# Patient Record
Sex: Female | Born: 1950 | Race: White | Hispanic: No | Marital: Married | Smoking: Never smoker
Health system: Southern US, Community
[De-identification: ages and names within clinical notes are randomized; demographics above are authoritative.]

## PROBLEM LIST (undated history)

## (undated) DIAGNOSIS — I872 Venous insufficiency (chronic) (peripheral): Secondary | ICD-10-CM

## (undated) DIAGNOSIS — B1920 Unspecified viral hepatitis C without hepatic coma: Secondary | ICD-10-CM

## (undated) DIAGNOSIS — Z9861 Coronary angioplasty status: Secondary | ICD-10-CM

## (undated) DIAGNOSIS — K759 Inflammatory liver disease, unspecified: Secondary | ICD-10-CM

## (undated) DIAGNOSIS — E039 Hypothyroidism, unspecified: Secondary | ICD-10-CM

## (undated) DIAGNOSIS — I10 Essential (primary) hypertension: Secondary | ICD-10-CM

## (undated) DIAGNOSIS — E785 Hyperlipidemia, unspecified: Secondary | ICD-10-CM

## (undated) DIAGNOSIS — I2510 Atherosclerotic heart disease of native coronary artery without angina pectoris: Secondary | ICD-10-CM

## (undated) DIAGNOSIS — I8390 Asymptomatic varicose veins of unspecified lower extremity: Secondary | ICD-10-CM

## (undated) DIAGNOSIS — T8859XA Other complications of anesthesia, initial encounter: Secondary | ICD-10-CM

## (undated) DIAGNOSIS — K649 Unspecified hemorrhoids: Secondary | ICD-10-CM

## (undated) DIAGNOSIS — M1990 Unspecified osteoarthritis, unspecified site: Secondary | ICD-10-CM

## (undated) DIAGNOSIS — Z955 Presence of coronary angioplasty implant and graft: Secondary | ICD-10-CM

## (undated) DIAGNOSIS — Z9289 Personal history of other medical treatment: Secondary | ICD-10-CM

## (undated) DIAGNOSIS — I2119 ST elevation (STEMI) myocardial infarction involving other coronary artery of inferior wall: Secondary | ICD-10-CM

## (undated) DIAGNOSIS — R011 Cardiac murmur, unspecified: Secondary | ICD-10-CM

## (undated) DIAGNOSIS — Z8489 Family history of other specified conditions: Secondary | ICD-10-CM

## (undated) HISTORY — DX: Venous insufficiency (chronic) (peripheral): I87.2

## (undated) HISTORY — DX: Hypothyroidism, unspecified: E03.9

## (undated) HISTORY — PX: ABDOMINAL HYSTERECTOMY: SHX81

## (undated) HISTORY — DX: Hyperlipidemia, unspecified: E78.5

## (undated) HISTORY — DX: Unspecified osteoarthritis, unspecified site: M19.90

## (undated) HISTORY — PX: BUNIONECTOMY: SHX129

## (undated) HISTORY — DX: Unspecified hemorrhoids: K64.9

## (undated) HISTORY — PX: VARICOSE VEIN SURGERY: SHX832

## (undated) HISTORY — DX: Presence of coronary angioplasty implant and graft: Z95.5

## (undated) HISTORY — PX: CYSTOCELE REPAIR: SHX163

## (undated) HISTORY — DX: Asymptomatic varicose veins of unspecified lower extremity: I83.90

## (undated) HISTORY — DX: ST elevation (STEMI) myocardial infarction involving other coronary artery of inferior wall: I21.19

## (undated) HISTORY — DX: Atherosclerotic heart disease of native coronary artery without angina pectoris: I25.10

## (undated) HISTORY — DX: Essential (primary) hypertension: I10

## (undated) HISTORY — DX: Coronary angioplasty status: Z98.61

## (undated) HISTORY — PX: WISDOM TOOTH EXTRACTION: SHX21

## (undated) HISTORY — PX: OTHER SURGICAL HISTORY: SHX169

## (undated) HISTORY — PX: LIVER BIOPSY: SHX301

---

## 2008-09-15 DIAGNOSIS — I2119 ST elevation (STEMI) myocardial infarction involving other coronary artery of inferior wall: Secondary | ICD-10-CM

## 2008-09-15 DIAGNOSIS — I2510 Atherosclerotic heart disease of native coronary artery without angina pectoris: Secondary | ICD-10-CM | POA: Insufficient documentation

## 2008-09-15 DIAGNOSIS — Z9861 Coronary angioplasty status: Secondary | ICD-10-CM

## 2008-09-15 DIAGNOSIS — Z955 Presence of coronary angioplasty implant and graft: Secondary | ICD-10-CM

## 2008-09-15 HISTORY — DX: Presence of coronary angioplasty implant and graft: Z95.5

## 2008-09-15 HISTORY — DX: Atherosclerotic heart disease of native coronary artery without angina pectoris: I25.10

## 2008-09-15 HISTORY — DX: Coronary angioplasty status: Z98.61

## 2008-09-15 HISTORY — PX: TRANSTHORACIC ECHOCARDIOGRAM: SHX275

## 2008-09-15 HISTORY — DX: ST elevation (STEMI) myocardial infarction involving other coronary artery of inferior wall: I21.19

## 2008-09-28 DIAGNOSIS — I252 Old myocardial infarction: Secondary | ICD-10-CM | POA: Insufficient documentation

## 2008-10-16 HISTORY — PX: TRANSTHORACIC ECHOCARDIOGRAM: SHX275

## 2008-10-23 DIAGNOSIS — Z955 Presence of coronary angioplasty implant and graft: Secondary | ICD-10-CM | POA: Insufficient documentation

## 2008-10-23 HISTORY — PX: CORONARY ANGIOPLASTY WITH STENT PLACEMENT: SHX49

## 2008-10-23 HISTORY — PX: CARDIAC CATHETERIZATION: SHX172

## 2010-11-17 ENCOUNTER — Other Ambulatory Visit: Payer: Self-pay | Admitting: Family Medicine

## 2010-11-17 DIAGNOSIS — Z1231 Encounter for screening mammogram for malignant neoplasm of breast: Secondary | ICD-10-CM

## 2010-11-27 ENCOUNTER — Ambulatory Visit: Payer: Self-pay

## 2011-08-31 HISTORY — PX: OTHER SURGICAL HISTORY: SHX169

## 2013-01-12 ENCOUNTER — Encounter: Payer: Self-pay | Admitting: *Deleted

## 2013-01-13 ENCOUNTER — Encounter: Payer: Self-pay | Admitting: Internal Medicine

## 2013-01-17 ENCOUNTER — Encounter: Payer: Self-pay | Admitting: Cardiology

## 2013-01-17 ENCOUNTER — Ambulatory Visit (INDEPENDENT_AMBULATORY_CARE_PROVIDER_SITE_OTHER): Admitting: Cardiology

## 2013-01-17 VITALS — BP 110/84 | HR 69 | Ht 66.0 in | Wt 171.2 lb

## 2013-01-17 DIAGNOSIS — E785 Hyperlipidemia, unspecified: Secondary | ICD-10-CM

## 2013-01-17 DIAGNOSIS — I2510 Atherosclerotic heart disease of native coronary artery without angina pectoris: Secondary | ICD-10-CM

## 2013-01-17 DIAGNOSIS — M1990 Unspecified osteoarthritis, unspecified site: Secondary | ICD-10-CM

## 2013-01-17 DIAGNOSIS — I872 Venous insufficiency (chronic) (peripheral): Secondary | ICD-10-CM

## 2013-01-17 DIAGNOSIS — I10 Essential (primary) hypertension: Secondary | ICD-10-CM

## 2013-01-17 NOTE — Patient Instructions (Signed)
Things seem stable from a cardiac standpoint -- Blood Pressure & HR are stable.  Please ask your PCP to forward your blood work to our office.  I think we are fine for annual check-ups -- you should hear from the office within ~ 2 months of your scheduled time to confirm the date & time.  Marykay Lex, MD

## 2013-01-31 ENCOUNTER — Encounter: Payer: Self-pay | Admitting: Cardiology

## 2013-01-31 DIAGNOSIS — I10 Essential (primary) hypertension: Secondary | ICD-10-CM | POA: Insufficient documentation

## 2013-01-31 DIAGNOSIS — I872 Venous insufficiency (chronic) (peripheral): Secondary | ICD-10-CM | POA: Insufficient documentation

## 2013-01-31 DIAGNOSIS — E785 Hyperlipidemia, unspecified: Secondary | ICD-10-CM | POA: Insufficient documentation

## 2013-01-31 NOTE — Progress Notes (Signed)
PCP: Warrick Parisian, MD  Clinic Note: Chief Complaint  Patient presents with  . ROV 12 months    Varicose veins, occasional swelling in ankles   HPI: Ashley Morse is a 62 y.o. female with a PMH below who presents today for followup of her coronary disease and venous disease. She is a very pleasant woman who I met back in 2012 after she moved from [place]. She has a history of inferior STEMI with PCI to the PDA. This is with 2 overlapping drug-eluting stents. She has preserved at ejection fraction. Her other major issue has been significant varicose veins, with a history of prior vein stripping while in [place].. Dr. Rennis Golden attempted RFA VNUS ablation of the left greater saphenous vein, that was unsuccessful because the dye was extremely dilated. She was given it to be referred to vascular surgery for possible stripping, but his to date declined. She is usually pretty good about wearing compression stockings in the wintertime, but in the summertime cannot tolerate it due to the.  Interval History: She presents today well she occasionally will notice her heart rate will race for several seconds when she is anxious. It lasts less than a minute and really doesn't make her feel lightheaded or dizzy. She otherwise really denies any active cardiac symptoms of chest pain or shortness of breath with rest or exertion. No PND orthopnea pedal along with her varicose veins leg edema. She doesn't have much edema to speak of. She denies any lightheadedness, dizziness, weakness or syncope/near syncope no TIA or amaurosis fugax symptoms. No melena, hematochezia or hematuria -- she may note occasional blood in his stool from hemorrhoids if they flare up, but otherwise does not notice any problems.  Her PCP recently stopped her simvastatin for concern of elevated LFTs. The levels are due to be rechecked soon, and the decision made whether to restart statin and if not Melissa statin intolerance. She is on  simvastatin before. She denies any claudication symptoms. She does have heaviness in her legs and some discomfort and aching from her varicose veins. She is to use it, and doesn't really think about it very much.  Past Medical History  Diagnosis Date  . History of: ST elevation myocardial infarction (STEMI) of inferior wall, subsequent episode of care May 2010    R- PDA occlusion --> PCI with DES  . CAD S/P percutaneous coronary angioplasty May 2010    Distal RPDA: 2 overlapping Promus and DES 2.5 mm x 15 mm, 2.5 mm 23 mm distal to mid RCA. EF 50%. ([place], [place])   . Presence of drug coated stent in right coronary artery May 2010    See above; no longer on DAPT.  [name] Hyperlipidemia     on simvastatin stopped due to increase liver enzyme rechecked back to normal  . Hypertension   . Bilateral venous insufficiency     Bilateral lower extremity edema,significant varicose vein; s/p vein stripping and venous abaltionof greater saph. vein  --> proved to be unsuccessful 2/2 vessel size.; Occasionally wears compression stockings during the wintertime.  cannot tolerate during the summertime;   . Osteoarthritis   . Hypothyroidism     On levothyroxine  . Hemorrhoids    Prior Cardiac Evaluation and Past Surgical History: Past Surgical History  Procedure Laterality Date  . Cardiac catheterization  10/23/2008    in [place]- for inferior STEMI, occluded RPDA  . Coronary angioplasty with stent placement  10/23/2008    occulsion of RPDA  --  2 Promus DES stent overlapping ;2.5 x 15 and 2.5 x 23 mm stents from distal RCA to PDA, ef50%  . Left venous  duplex Left 08/31/2011    Negative for DVT  . Varicose vein surgery Right   . Radiofrequency ablation - left greater saphenous vein: unsuccessful Left 08/31/2011  . Transthoracic echocardiogram  May 2010     Echo, May 2010 - following PCI for inferior STEMI. No wall motion abnormalities, EF of 60%   Allergies  Allergen Reactions  . Lactose  Intolerance (Gi)   . Sulfa Antibiotics Swelling    Skin discoloration    Current Outpatient Prescriptions  Medication Sig Dispense Refill  . Ascorbic Acid (VITAMIN C PO) Take 1 tablet by mouth daily.      [name] aspirin EC 81 MG tablet Take 81 mg by mouth daily.      [name] b complex vitamins tablet Take 1 tablet by mouth daily.      . Coenzyme Q10 200 MG capsule Take 200 mg by mouth daily.      [name] glucosamine-chondroitin 500-400 MG tablet Take 1 tablet by mouth 3 (three) times daily.      [name] levothyroxine (SYNTHROID, LEVOTHROID) 88 MCG tablet Take 88 mcg by mouth daily before breakfast.      . losartan (COZAAR) 25 MG tablet Take 25 mg by mouth daily.      . Magnesium 400 MG CAPS Take by mouth.      . Melatonin 1 MG SUBL Place 0.5 tablets under the tongue at bedtime as needed.      . Multiple Vitamins-Minerals (MULTIVITAMIN WITH MINERALS) tablet Take 1 tablet by mouth daily.      . Omega-3 Fatty Acids (FISH OIL PO) Take 1 capsule by mouth daily.      [name] OVER THE COUNTER MEDICATION Bio Flavenoid. Take 1 tablet daily      . vitamin E (VITAMIN E) 400 UNIT capsule Take 400 Units by mouth daily.       No current facility-administered medications for this visit.    History   Social History Narrative   She is a married, mother of one living offspring with one child who died at young age.    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    She is starting to "embrace retirement" & i8 enjoying her free time.      She starting beekeeping and other activities.       She still trying to exercise the gym 2-3 times a week but now is up to 3-4 times a week in addition to doing walking.       She drinks occasional glass of wine, but if she drinks more not bothered her hemorrhoids.   She does not smoke.   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    ROS: A comprehensive Review of Systems - Negative except Osteoarthritis pain mostly in her right  knee, but also ankles hips.  PHYSICAL EXAM BP 110/84  Pulse 69  Ht 5\' 6"  (1.676 m)  Wt 171 lb 3.2 oz (77.656 kg)  BMI 27.65 kg/m2 -- stable weight General: She is a pleasant, healthy appearing woman. NAD, A&O x3, answers questions appropriately. Normal mood and affect.  HEENT: NCAT. She wears glasses. EOMI. MMM. PERRL.  Neck: Supple without LAN, JVD or carotid bruit BUT pulsatile carotid arteries that mask her jugular veins; no JVD Heart: RRR, normal S1,  S2, no M/R/G. Nondisplaced PMI.  Lungs: CTAB, nonlabored, normal effort, good air movement.  Abdomen: Soft/NT/ND/NABS. No HSM. Extremities: No significant edema but extensive bilateral varicose veins and significant spider veins and purplish discoloration of her feet, left worse than right.; 2+ equal and bounding pulses in bilateral lower extremities.   WUJ:[id number] today: Yes Rate: 69 , Rhythm: Normal Sinus Rhythm, Normal ECG.  Recent Labs: None currently.  ASSESSMENT / PLAN: CAD S/P percutaneous coronary angioplasty - 2 overlapping  Promus element DES in RPDA; in setting of acute inferior STEMI Currently in 100% asymptomatic. She related having much in the way of other existing disease besides her PDA occlusion. She is 4 years out from her stents, and has just been on aspirin. She is not on a beta blocker because of prior issues with bradycardia. She is on ARB doing quite well. She was a statin which is currently being held.  Plan: Continue current course. In the absence of ongoing symptoms, would not routinely perform stress test.  Hypertension Your blood pressure was very good and stable on low dose ARB. Again with her bradycardia issues in the past, she is not on a beta blocker.  Hyperlipidemia She is due for followup with her primary physician to recheck her labs including LFTs. She was on simvastatin in the past. She has significantly adjusted diet only fish and occasionally a slow vegetables and fruits. Hopefully this will  have improved her lipid panel. I look forward to seeing the results from her primary physician.  Bilateral venous insufficiency This continued to be an issue for her, she really "soldiers on "all. He unsuccessful the ablation was difficult, because initially it looked like he was noted to quite well. She still is not very interested in going back for vein stripping procedures. It was quite a difficult experience for her and the past. She is content with wearing support low-dose in the summer and compression stockings in the winter.  Osteoarthritis She really is somewhat limited by her knee pain from arthritis. I am reluctant to let her use Tylenol because of the elevated LFTs. NSAIDS are alsonot the best option  for similar reasons related to cardiovascular risk being increased. This is Focal in her knees and hips I suggested using Voltaren gel for topical control.    and may Orders Placed This Encounter  Procedures  . EKG 12-Lead    Followup: One year  Kamyia Thomason W. Herbie Baltimore, M.D., M.S. [facility] [address] [place], [place]  [address]  [phone] Pager # [phone]

## 2013-02-01 DIAGNOSIS — M1990 Unspecified osteoarthritis, unspecified site: Secondary | ICD-10-CM | POA: Insufficient documentation

## 2013-02-01 NOTE — Assessment & Plan Note (Signed)
She is due for followup with her primary physician to recheck her labs including LFTs. She was on simvastatin in the past. She has significantly adjusted diet only fish and occasionally a slow vegetables and fruits. Hopefully this will have improved her lipid panel. I look forward to seeing the results from her primary physician.

## 2013-02-01 NOTE — Assessment & Plan Note (Addendum)
Her blood pressure was very good and stable on low dose ARB. Again with her bradycardia issues in the past, she is not on a beta blocker.

## 2013-02-01 NOTE — Assessment & Plan Note (Addendum)
Currently in 100% asymptomatic. She related having much in the way of other existing disease besides her PDA occlusion. She is 4 years out from her stents, and has just been on aspirin. She is not on a beta blocker because of prior issues with bradycardia. She is on ARB doing quite well. She was a statin which is currently being held.  Plan: Continue current course. In the absence of ongoing symptoms, would not routinely perform stress test.

## 2013-02-01 NOTE — Assessment & Plan Note (Signed)
This continued to be an issue for her, she really "soldiers on "all. He unsuccessful the ablation was difficult, because initially it looked like he was noted to quite well. She still is not very interested in going back for vein stripping procedures. It was quite a difficult experience for her and the past. She is content with wearing support low-dose in the summer and compression stockings in the winter.

## 2013-02-01 NOTE — Assessment & Plan Note (Signed)
She really is somewhat limited by her knee pain from arthritis. I am reluctant to let her use Tylenol because of the elevated LFTs. NSAIDS are alsonot the best option  for similar reasons related to cardiovascular risk being increased. This is Focal in her knees and hips I suggested using Voltaren gel for topical control.

## 2013-04-17 ENCOUNTER — Ambulatory Visit (HOSPITAL_COMMUNITY)
Admission: RE | Admit: 2013-04-17 | Discharge: 2013-04-17 | Disposition: A | Discharge status: Home / Self Care | Source: Ambulatory Visit | Attending: Cardiovascular Disease | Admitting: Cardiovascular Disease

## 2013-04-17 ENCOUNTER — Other Ambulatory Visit (HOSPITAL_COMMUNITY): Payer: Self-pay | Admitting: Family Medicine

## 2013-04-17 DIAGNOSIS — I803 Phlebitis and thrombophlebitis of lower extremities, unspecified: Secondary | ICD-10-CM

## 2013-04-17 DIAGNOSIS — M79609 Pain in unspecified limb: Secondary | ICD-10-CM

## 2013-04-17 DIAGNOSIS — I809 Phlebitis and thrombophlebitis of unspecified site: Secondary | ICD-10-CM

## 2013-04-17 DIAGNOSIS — I82819 Embolism and thrombosis of superficial veins of unspecified lower extremities: Secondary | ICD-10-CM | POA: Insufficient documentation

## 2013-04-17 DIAGNOSIS — R229 Localized swelling, mass and lump, unspecified: Secondary | ICD-10-CM

## 2013-04-17 DIAGNOSIS — I83893 Varicose veins of bilateral lower extremities with other complications: Secondary | ICD-10-CM | POA: Insufficient documentation

## 2013-04-17 NOTE — Progress Notes (Signed)
Left Lower Ext. Venous Duplex Completed. Negative for DVT. Positive for superficial vein thrombosis in the proximal cal vein varicosities.  Marilynne Halsted, BS, RDMS, RVT

## 2014-01-23 ENCOUNTER — Ambulatory Visit (INDEPENDENT_AMBULATORY_CARE_PROVIDER_SITE_OTHER): Admitting: Cardiology

## 2014-01-23 ENCOUNTER — Encounter: Payer: Self-pay | Admitting: Cardiology

## 2014-01-23 VITALS — BP 114/82 | HR 77 | Ht 65.25 in | Wt 168.0 lb

## 2014-01-23 DIAGNOSIS — E785 Hyperlipidemia, unspecified: Secondary | ICD-10-CM

## 2014-01-23 DIAGNOSIS — I2510 Atherosclerotic heart disease of native coronary artery without angina pectoris: Secondary | ICD-10-CM

## 2014-01-23 DIAGNOSIS — I10 Essential (primary) hypertension: Secondary | ICD-10-CM

## 2014-01-23 DIAGNOSIS — Z9861 Coronary angioplasty status: Secondary | ICD-10-CM

## 2014-01-23 DIAGNOSIS — I872 Venous insufficiency (chronic) (peripheral): Secondary | ICD-10-CM

## 2014-01-23 NOTE — Patient Instructions (Signed)
NO CHANGE MEDICATION   Your physician wants you to follow-up in 12 MONTH DR HARDING.  You will receive a reminder letter in the mail two months in advance. If you don't receive a letter, please call our office to schedule the follow-up appointment.

## 2014-01-25 ENCOUNTER — Encounter: Payer: Self-pay | Admitting: Cardiology

## 2014-01-25 NOTE — Progress Notes (Addendum)
PCP: Warrick Parisian, MD  Clinic Note: Chief Complaint  Patient presents with  . Annual Exam    no chest pain , no sob , edema --- will be having annual physical next week =- [facility]  [place]   HPI: Ashley Morse is a 63 y.o. female with a Cardiovascular Problem List below who presents today for annual followup of her CAD and venous disease. I last saw her in September of 2014. At that time she was doing relatively well without any major complaints. The biggest issue discussed was her venous insufficiency. She basically just can't tolerate wearing support hose or compression stockings during the summertime. She doesn't wear them in the winter however. I don't think she's interested in pursuing any further vein procedures, we did mention the possibility of referral to the vein a vascular specialists if she would change her mind.  Interval History: She presents today really without any complaints. She is due to have her physical exam next week and will have her labs checked. She has not had any recurrence of any anginal symptoms with rest or exertion. She still has short we'll episode of burst of heartbeat that her locally fast only last about 15-20 sec. They don't last long enough to cause any significant symptoms. She continues to be active but without structured exercise. She still does her be keeping another clonic activities. She still does try to go to the gym a couple days a week. With her level of exertion she denies any anginal symptoms lesser exertion. No exertional dyspnea. No PND, orthopnea or real edema. Is mostly varicose veins. She does not have any ulcers or lesions on her legs and just notes a little dullness in the leg but no significant restless leg symptoms or burning neuropathy type pain. She denies any syncopal or near syncopal episodes, TIA or amaurosis fugax symptoms. He also denies any claudication symptoms.  She never did start back on a statin since last  visit. We're curious to see what her labs look like this year.  Past Medical History  Diagnosis Date  . History of: ST elevation myocardial infarction (STEMI) of inferior wall, subsequent episode of care May 2010    R- PDA occlusion --> PCI with DES  . CAD S/P percutaneous coronary angioplasty May 2010    Distal RPDA: 2 overlapping Promus and DES 2.5 mm x 15 mm, 2.5 mm 23 mm distal to mid RCA. EF 50%. ([place], [place])   . Presence of drug coated stent in right coronary artery May 2010    See above; no longer on DAPT.  [name] Hyperlipidemia     on simvastatin stopped due to increase liver enzyme rechecked back to normal  . Hypertension   . Bilateral venous insufficiency     Bilateral lower extremity edema,significant varicose vein; s/p vein stripping and venous abaltionof greater saph. vein  --> proved to be unsuccessful 2/2 vessel size.; Occasionally wears compression stockings during the wintertime.  cannot tolerate during the summertime;   . Osteoarthritis   . Hypothyroidism     On levothyroxine  . Hemorrhoids    Prior Cardiac Evaluation and Past Surgical History: Past Surgical History  Procedure Laterality Date  . Cardiac catheterization  10/23/2008    in [place]- for inferior STEMI, occluded RPDA  . Coronary angioplasty with stent placement  10/23/2008    occulsion of RPDA  -- 2 Promus DES stent overlapping ;2.5 x 15 and 2.5 x 23 mm stents from distal RCA to  PDA, ef50%  . Left venous  duplex Left 08/31/2011    Negative for DVT  . Varicose vein surgery Right   . Radiofrequency ablation - left greater saphenous vein: unsuccessful Left 08/31/2011  . Transthoracic echocardiogram  May 2010     Echo, May 2010 - following PCI for inferior STEMI. No wall motion abnormalities, EF of 60%   MEDICATIONS AND ALLERGIES REVIEWED IN EPIC No Change in Social and Family History  ROS: A comprehensive Review of Systems - was performed Review of Systems  Constitutional: Negative for fever  and chills.  HENT: Negative for nosebleeds.   Eyes: Negative for blurred vision.  Gastrointestinal: Negative for melena.  Genitourinary: Negative for hematuria.  Musculoskeletal: Negative for myalgias.  Neurological: Negative for dizziness, sensory change, speech change, focal weakness, seizures, loss of consciousness, weakness and headaches.  Psychiatric/Behavioral: Negative for depression. The patient is not nervous/anxious.   All other systems reviewed and are negative.  if not noted in history of present illness   Wt Readings from Last 3 Encounters:  01/23/14 168 lb (76.204 kg)  01/17/13 171 lb 3.2 oz (77.656 kg)   PHYSICAL EXAM BP 114/82  Pulse 77  Ht 5' 5.25" (1.657 m)  Wt 168 lb (76.204 kg)  BMI 27.75 kg/m2 General: She is a pleasant, healthy appearing woman. NAD, A&O x3, answers questions appropriately. Normal mood and affect.  HEENT: NCAT. She wears glasses. EOMI. MMM. PERRL.  Neck: Supple without LAN, JVD or carotid bruit BUT pulsatile carotid arteries that mask her jugular veins; no JVD  Heart: RRR, normal S1, S2, no M/R/G. Nondisplaced PMI.  Lungs: CTAB, nonlabored, normal effort, good air movement.  Abdomen: Soft/NT/ND/NABS. No HSM.  Extremities: No significant edema but extensive bilateral varicose veins and significant spider veins and purplish discoloration of her feet, left worse than right.; 2+ equal and bounding pulses in bilateral lower extremities.   Adult ECG Report  Rate: 70 ;  Rhythm: normal sinus rhythm - essentially normal EKG  ASSESSMENT / PLAN: CAD S/P percutaneous coronary angioplasty - 2 overlapping  Promus element DES in RPDA; in setting of acute inferior STEMI Continues to be asymptomatic. Is on an ARB, but not on beta blocker because of history of some bradycardia in the past. On aspirin but not on a statin as indicated above. She is taking fish oil that I have asked that she increase to 3000 mg if possible. Now 5 years out from her stents. On  aspirin alone is fine. She is known to PDA occlusion that goes along with inferior hypokinesis on echo  Venous insufficiency of both lower extremities She continues to just handle the situation. Was a little bit "bummed out "that the ablation procedure was not successful. She's had in the past and is not really interested in doing more procedures. I did offer her a referral to the vascular name specialist. Dr. Arbie Cookey has essentially taken over the role that Dr. Rennis Golden he was playing here, but also has more treatment options as surgeon. The new wearing compression stockings/support hose in the wintertime and when possible summer. Otherwise keep feet elevated when possible  Hyperlipidemia with target LDL less than 70; intolerant of statin in the past due to elevated LFTs Labs are being monitored by PCP. Hopefully being off the statin her labs drawn too much out of control.  Essential hypertension Well controlled on ARB only.    No orders of the defined types were placed in this encounter.   No orders of the defined  types were placed in this encounter.    Followup: 12 months  DAVID W. Herbie Baltimore, M.D., M.S. Interventional Cardiologist [facility]   ADDENDUM: After further discussion, she is potentially in need to have surgery on her foot for bunion because there is significant venous stasis and some redness to her feet, she is asked that we do refer her to the vascular surgeons (Dr. Arbie Cookey) to evaluate both venous and arterial flow to the feet in the interest of healing or possible surgery. She would also entertain potential options for treating her varicose veins.  Marykay Lex, MD

## 2014-01-25 NOTE — Assessment & Plan Note (Signed)
Labs are being monitored by PCP. Hopefully being off the statin her labs drawn too much out of control.

## 2014-01-25 NOTE — Assessment & Plan Note (Signed)
She continues to just handle the situation. Was a little bit "bummed out "that the ablation procedure was not successful. She's had in the past and is not really interested in doing more procedures. I did offer her a referral to the vascular name specialist. Dr. Arbie Cookey has essentially taken over the role that Dr. Rennis Golden he was playing here, but also has more treatment options as surgeon. The new wearing compression stockings/support hose in the wintertime and when possible summer. Otherwise keep feet elevated when possible

## 2014-01-25 NOTE — Assessment & Plan Note (Signed)
Well controlled on ARB only.

## 2014-01-25 NOTE — Assessment & Plan Note (Addendum)
Continues to be asymptomatic. Is on an ARB, but not on beta blocker because of history of some bradycardia in the past. On aspirin but not on a statin as indicated above. She is taking fish oil that I have asked that she increase to 3000 mg if possible. Now 5 years out from her stents. On aspirin alone is fine. She is known to PDA occlusion that goes along with inferior hypokinesis on echo

## 2014-01-26 ENCOUNTER — Telehealth: Payer: Self-pay | Admitting: Cardiology

## 2014-01-26 NOTE — Telephone Encounter (Signed)
Patient called  Inform that the attending doctor for AMY, NP is Dr Fuller Mandril at [facility] RN informed patient will place in her [facility]  CHART

## 2014-01-26 NOTE — Telephone Encounter (Signed)
ROUTED OFFICE NOTE TO DR Doristine Counter

## 2014-02-13 ENCOUNTER — Telehealth: Payer: Self-pay | Admitting: Cardiology

## 2014-02-13 DIAGNOSIS — I872 Venous insufficiency (chronic) (peripheral): Secondary | ICD-10-CM

## 2014-02-13 NOTE — Telephone Encounter (Signed)
Pt said that Dr.Harding was going to refer her to a vein specialist for her varicose veins. She has not heard anything from him yet. Please call  Thanks

## 2014-02-14 NOTE — Addendum Note (Signed)
Addended by: Tobin ChadMARTIN, SHARON V. on: 02/14/2014 04:42 PM   Modules accepted: Orders

## 2014-02-14 NOTE — Telephone Encounter (Signed)
INFORMED PATIENT -REFERRAL WITH DR EARLY WILL BE DONE.

## 2014-02-14 NOTE — Telephone Encounter (Signed)
Yep - Dr. Arbie CookeyEarly.  Marykay LexHARDING,DAVID W, MD

## 2014-02-14 NOTE — Telephone Encounter (Signed)
SPOKE TO PATIENT  SHE STATES SHE WOULD LIKE AN REFERRAL TO DISCUSS OPTIONS FOR VARICOSE VEINS.  RN INFORMED PATIENT THAT WILLVERIFY WITH DR HARDING -IF DR EARLY IS THE PHYSICIAN IN QUESTION    WILL MAKE AN REFERRAL. PATIENT AWARE ANDWILL CALL HER BACK

## 2014-03-09 ENCOUNTER — Other Ambulatory Visit: Payer: Self-pay

## 2014-03-09 DIAGNOSIS — I83893 Varicose veins of bilateral lower extremities with other complications: Secondary | ICD-10-CM

## 2014-03-26 ENCOUNTER — Encounter (HOSPITAL_COMMUNITY)

## 2014-03-26 ENCOUNTER — Encounter: Admitting: Surgery

## 2014-08-30 ENCOUNTER — Encounter: Payer: Self-pay | Admitting: Internal Medicine

## 2015-01-25 ENCOUNTER — Encounter: Payer: Self-pay | Admitting: Cardiology

## 2015-01-25 ENCOUNTER — Ambulatory Visit (INDEPENDENT_AMBULATORY_CARE_PROVIDER_SITE_OTHER): Payer: Medicare Other | Admitting: Cardiology

## 2015-01-25 VITALS — BP 104/82 | HR 76 | Ht 66.0 in | Wt 171.8 lb

## 2015-01-25 DIAGNOSIS — E785 Hyperlipidemia, unspecified: Secondary | ICD-10-CM | POA: Diagnosis not present

## 2015-01-25 DIAGNOSIS — I872 Venous insufficiency (chronic) (peripheral): Secondary | ICD-10-CM | POA: Diagnosis not present

## 2015-01-25 DIAGNOSIS — I10 Essential (primary) hypertension: Secondary | ICD-10-CM

## 2015-01-25 DIAGNOSIS — M79605 Pain in left leg: Secondary | ICD-10-CM

## 2015-01-25 DIAGNOSIS — Z9861 Coronary angioplasty status: Secondary | ICD-10-CM | POA: Diagnosis not present

## 2015-01-25 DIAGNOSIS — M79604 Pain in right leg: Secondary | ICD-10-CM

## 2015-01-25 DIAGNOSIS — I2510 Atherosclerotic heart disease of native coronary artery without angina pectoris: Secondary | ICD-10-CM

## 2015-01-25 DIAGNOSIS — R000 Tachycardia, unspecified: Secondary | ICD-10-CM

## 2015-01-25 NOTE — Progress Notes (Signed)
PCP: Delorse Lek, MD  Clinic Note: Chief Complaint  Patient presents with  . Follow-up    10 months:  Occas. episodes of rapid heart beat relieved with resting.   Feels like she is vibrating when this happens.   . Coronary Artery Disease  . Varicose Veins    HPI: Ashley Morse is a 64 y.o. female with a PMH below who presents today for annual f/u of CAD, palpitations & venous insufficiency  Ashley Morse was last seen in Sept. 2015 - no complaints;   Recent Hospitalizations: n/a  Studies Reviewed: n/a  Interval History: Doing pretty well from a CV standpoint. NO angina symptoms @ rest or with exertion.  No CHF Sx.   Still has rare episodes of rapid/racing HR (like vibrating) -- 1st episode in November -- felt weak/ tired & dizzy -- lasted ~10 min or so HR felt like it was going fast -- self limiting.  BP was initially up a bit, but came down normally.   Better after drink H2O. Has had occasional milder episodes since then.  Cardiovascular ROS: no chest pain or dyspnea on exertion positive for - palpitations, rapid heart rate and near syncope - total of ~3-4 in last yr negative for - irregular heartbeat, orthopnea, paroxysmal nocturnal dyspnea, shortness of breath or no edema - but stable Varicose veins   Past Medical History  Diagnosis Date  . History of: ST elevation myocardial infarction (STEMI) of inferior wall, subsequent episode of care May 2010    R- PDA occlusion --> PCI with DES  . CAD S/P percutaneous coronary angioplasty May 2010    Distal RPDA: 2 overlapping Promus and DES 2.5 mm x 15 mm, 2.5 mm 23 mm distal to mid RCA. EF 50%. ([place], [place])   . Presence of drug coated stent in right coronary artery May 2010    See above; no longer on DAPT.  [name] Hyperlipidemia     on simvastatin stopped due to increase liver enzyme rechecked back to normal  . Hypertension   . Bilateral venous insufficiency     Bilateral lower extremity edema,significant varicose  vein; s/p vein stripping and venous abaltionof greater saph. vein  --> proved to be unsuccessful 2/2 vessel size.; Occasionally wears compression stockings during the wintertime.  cannot tolerate during the summertime;   . Osteoarthritis   . Hypothyroidism     On levothyroxine  . Hemorrhoids     Past Surgical History  Procedure Laterality Date  . Cardiac catheterization  10/23/2008    in [place]- for inferior STEMI, occluded RPDA  . Coronary angioplasty with stent placement  10/23/2008    occulsion of RPDA  -- 2 Promus DES stent overlapping ;2.5 x 15 and 2.5 x 23 mm stents from distal RCA to PDA, ef50%  . Left venous  duplex Left 08/31/2011    Negative for DVT  . Varicose vein surgery Right   . Radiofrequency ablation - left greater saphenous vein: unsuccessful Left 08/31/2011  . Transthoracic echocardiogram  May 2010     Echo, May 2010 - following PCI for inferior STEMI. No wall motion abnormalities, EF of 60%    ROS: A comprehensive was performed. Review of Systems  Constitutional: Negative for malaise/fatigue.  HENT: Negative for nosebleeds.   Respiratory: Negative for wheezing.   Cardiovascular: Negative for claudication.  Gastrointestinal: Positive for blood in stool. Negative for melena.       Occasional hemorrhoidal bleed - has Colonoscopy scheduled & GI f/u  Genitourinary:  Negative for hematuria.  Neurological: Positive for dizziness (see HPI). Negative for weakness.  Endo/Heme/Allergies: Does not bruise/bleed easily.  All other systems reviewed and are negative.   Prior to Admission medications   Medication Sig Start Date End Date Taking? Authorizing Provider  Ascorbic Acid (VITAMIN C PO) Take 1 tablet by mouth daily.   Yes Historical Provider, MD  aspirin EC 81 MG tablet Take 81 mg by mouth daily.   Yes Historical Provider, MD  b complex vitamins tablet Take 1 tablet by mouth daily.   Yes Historical Provider, MD  busPIRone (BUSPAR) 5 MG tablet Take 5 mg by mouth 3  (three) times daily as needed. 01/17/15  Yes Historical Provider, MD  Cholecalciferol (VITAMIN D3) 5000 UNITS CAPS Take 1 capsule by mouth daily.   Yes Historical Provider, MD  Coenzyme Q10 200 MG capsule Take 200 mg by mouth daily.   Yes Historical Provider, MD  glucosamine-chondroitin 500-400 MG tablet Take 1 tablet by mouth 3 (three) times daily.   Yes Historical Provider, MD  Lactobacillus (ACIDOPHILUS PO) Take 1 capsule by mouth daily.   Yes Historical Provider, MD  levothyroxine (SYNTHROID, LEVOTHROID) 88 MCG tablet Take 88 mcg by mouth daily before breakfast.   Yes Historical Provider, MD  losartan (COZAAR) 25 MG tablet Take 25 mg by mouth daily.   Yes Historical Provider, MD  Magnesium 400 MG CAPS Take by mouth.   Yes Historical Provider, MD  Melatonin 1 MG SUBL Place 0.5 tablets under the tongue at bedtime as needed.   Yes Historical Provider, MD  Multiple Vitamins-Minerals (MULTIVITAMIN WITH MINERALS) tablet Take 1 tablet by mouth daily.   Yes Historical Provider, MD  Omega-3 Fatty Acids (FISH OIL PO) Take 1 capsule by mouth daily.   Yes Historical Provider, MD  OVER THE COUNTER MEDICATION Bio Flavenoid. Take 1 tablet daily   Yes Historical Provider, MD  vitamin E (VITAMIN E) 400 UNIT capsule Take 400 Units by mouth daily.   Yes Historical Provider, MD    Allergies  Allergen Reactions  . Lactose Intolerance (Gi)   . Sulfa Antibiotics Swelling    Skin discoloration    Social History   Social History  . Marital Status: Married    Spouse Name: N/A  . Number of Children: N/A  . Years of Education: N/A   Social History Main Topics  . Smoking status: Never Smoker   . Smokeless tobacco: Never Used  . Alcohol Use: Yes     Comment: Occasionally  . Drug Use: None  . Sexual Activity: Not Asked   Other Topics Concern  . None   Social History Narrative   She is a married, mother of one living offspring with one child who died at young age.    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    She is starting to "embrace retirement" & i8 enjoying her free time.      She starting beekeeping and other activities.       She still trying to exercise the gym 2-3 times a week but now is up to 3-4 times a week in addition to doing walking.       She drinks occasional glass of wine, but if she drinks more not bothered her hemorrhoids.   She does not smoke.   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   Family History  Problem Relation Age of Onset  .  Stroke Mother   . Heart disease Mother   . Diabetes Sister   . Lymphoma Daughter     Wt Readings from Last 3 Encounters:  01/25/15 171 lb 12.8 oz (77.928 kg)  01/23/14 168 lb (76.204 kg)  01/17/13 171 lb 3.2 oz (77.656 kg)    PHYSICAL EXAM BP 104/82 mmHg  Pulse 76  Ht 5\' 6"  (1.676 m)  Wt 171 lb 12.8 oz (77.928 kg)  BMI 27.74 kg/m2 General: She is a pleasant, healthy appearing woman. NAD, A&O x3, answers questions appropriately. Normal mood and affect.  HEENT: NCAT. She wears glasses. EOMI. MMM. PERRL.  Neck: Supple without LAN, JVD or carotid bruit BUT pulsatile carotid arteries that mask her jugular veins; no JVD  Heart: RRR, normal S1, S2, no M/R/G. Nondisplaced PMI.  Lungs: CTAB, nonlabored, normal effort, good air movement.  Abdomen: Soft/NT/ND/NABS. No HSM.  Extremities: No significant edema but extensive bilateral bulging varicose veins and significant spider veins and purplish discoloration of her feet, left worse than right.; 2+ equal and bounding pulses in bilateral lower extremities. Neuro: Grossly normal.  CN II-XII grossly intact    Adult ECG Report  Rate: 76 ;  Rhythm: normal sinus rhythm;   Narrative Interpretation: Stable, normal EKG  Other studies Reviewed: Additional studies/ records that were reviewed today include:  Recent Labs: 12/26/2014 from PCP   Na+ 141, K+ 4.3, Cl- 101, HCO3- 23 , BUN 17, Cr 0.6, Glu 89, Ca2+ 9.4;  AST 46, ALT 32; AlkP 74, TP 7.8, T Bili 0.3  TSH 3.32  TC 182, TG 95, HDL 72, LDL 91   ASSESSMENT / PLAN: Problem List Items Addressed This Visit    CAD S/P percutaneous coronary angioplasty - 2 overlapping  Promus element DES in RPDA; in setting of acute inferior STEMI - Primary (Chronic)    Now over 6 years out from her MI and PCI with known occluded PDA.[name] No active anginal symptoms or heart failure symptoms. She remains on baby aspirin on ARB. None beta blocker due to prior bradycardia. Statin intolerant.      Relevant Orders   EKG 12-Lead   Essential hypertension (Chronic)    Excellent control current dose of ARB.      Relevant Orders   EKG 12-Lead   Hyperlipidemia with target LDL less than 70; intolerant of statin in the past due to elevated LFTs (Chronic)    Currently labs are monitored by PCP.her LDL is not at goal, I like to see it better control. She is taking co-Q10. For now defer to PCP, but if the lipids are still above goal, would strongly consider fenofibrate or Zetia.      Relevant Orders   EKG 12-Lead   Rapid heart beat    We have episodes of racing/palpitations that are not very long-acting. At this point, other syrup that he was much more frequency, reluctant to put her on a beta blocker which could exacerbate her bradycardia issues. They do happen enough to have her wear a monitor, and are not significant enough to consider loop recorder.      Relevant Orders   EKG 12-Lead   Venous insufficiency of both lower extremities (Chronic)    She has significant dilated varicose veins bilaterally. Apparently she has had some vein stripping in the past but did not do very well. Now however she is starting to get lower leg discomfort and aching. She is willing to discuss potential ablation options.  Plan: Refer to Dr. Tawanna Cooler Early --  vascular surgeon.      Relevant Orders   EKG 12-Lead   Ambulatory referral to Vascular Surgery    Other Visit Diagnoses    Bilateral  leg pain        Relevant Orders    Ambulatory referral to Vascular Surgery       Current medicines are reviewed at length with the patient today. (+/- concerns) n/a The following changes have been made: n/a Studies Ordered:   Orders Placed This Encounter  Procedures  . Ambulatory referral to Vascular Surgery  . EKG 12-Lead   PATIENT INSTRUCTIONS:  You have been referred to Dr Arbie Cookey- DISCUSS VARICOSE VEINS  KEEP MONITOR ON YOUR PALPATIONS , IF INCREASE CALL OFFICE ,MAY CHANGE  MEDICATIONS.   Your physician wants you to follow-up in 12 MONTHS WITH DR Ulis Rias, M.D., M.S. Interventional Cardiologist   Pager # [phone]

## 2015-01-25 NOTE — Patient Instructions (Signed)
You have been referred to Dr Arbie Cookey- DISCUSS VARICOSE VEINS  KEEP MONITOR ON YOUR PALPATIONS , IF INCREASE CALL OFFICE ,MAY CHANGE  MEDICATIONS.   Your physician wants you to follow-up in 12 MONTHS WITH DR Herbie Baltimore, You will receive a reminder letter in the mail two months in advance. If you don't receive a letter, please call our office to schedule the follow-up appointment.

## 2015-01-27 ENCOUNTER — Encounter: Payer: Self-pay | Admitting: Cardiology

## 2015-01-27 NOTE — Assessment & Plan Note (Signed)
Currently labs are monitored by PCP.her LDL is not at goal, I like to see it better control. She is taking co-Q10. For now defer to PCP, but if the lipids are still above goal, would strongly consider fenofibrate or Zetia.

## 2015-01-27 NOTE — Assessment & Plan Note (Signed)
Now over 6 years out from her MI and PCI with known occluded PDA.[name] No active anginal symptoms or heart failure symptoms. She remains on baby aspirin on ARB. None beta blocker due to prior bradycardia. Statin intolerant.

## 2015-01-27 NOTE — Assessment & Plan Note (Signed)
She has significant dilated varicose veins bilaterally. Apparently she has had some vein stripping in the past but did not do very well. Now however she is starting to get lower leg discomfort and aching. She is willing to discuss potential ablation options.  Plan: Refer to Dr. Tawanna Cooler Early -- vascular surgeon.

## 2015-01-27 NOTE — Assessment & Plan Note (Signed)
Excellent control current dose of ARB.

## 2015-01-27 NOTE — Assessment & Plan Note (Addendum)
We have episodes of racing/palpitations that are not very long-acting. At this point, other syrup that he was much more frequency, reluctant to put her on a beta blocker which could exacerbate her bradycardia issues. They do happen enough to have her wear a monitor, and are not significant enough to consider loop recorder.

## 2015-01-31 ENCOUNTER — Encounter: Payer: Self-pay | Admitting: Cardiology

## 2015-02-05 ENCOUNTER — Other Ambulatory Visit: Payer: Self-pay

## 2015-02-05 DIAGNOSIS — I83893 Varicose veins of bilateral lower extremities with other complications: Secondary | ICD-10-CM

## 2015-03-28 ENCOUNTER — Encounter: Payer: Self-pay | Admitting: Surgery

## 2015-04-01 ENCOUNTER — Encounter: Payer: Self-pay | Admitting: Surgery

## 2015-04-01 ENCOUNTER — Ambulatory Visit (HOSPITAL_COMMUNITY)
Admission: RE | Admit: 2015-04-01 | Discharge: 2015-04-01 | Disposition: A | Discharge status: Home / Self Care | Payer: Medicare Other | Source: Ambulatory Visit | Attending: Surgery | Admitting: Surgery

## 2015-04-01 ENCOUNTER — Ambulatory Visit (INDEPENDENT_AMBULATORY_CARE_PROVIDER_SITE_OTHER): Payer: Medicare Other | Admitting: Surgery

## 2015-04-01 VITALS — BP 117/82 | HR 74 | Temp 98.0°F | Resp 16 | Ht 66.0 in | Wt 169.0 lb

## 2015-04-01 DIAGNOSIS — I872 Venous insufficiency (chronic) (peripheral): Secondary | ICD-10-CM | POA: Diagnosis not present

## 2015-04-01 DIAGNOSIS — Z9861 Coronary angioplasty status: Secondary | ICD-10-CM

## 2015-04-01 DIAGNOSIS — I83893 Varicose veins of bilateral lower extremities with other complications: Secondary | ICD-10-CM

## 2015-04-01 DIAGNOSIS — I2510 Atherosclerotic heart disease of native coronary artery without angina pectoris: Secondary | ICD-10-CM | POA: Diagnosis not present

## 2015-04-01 NOTE — Progress Notes (Signed)
Referred by:  Delorse Lek, MD [address] [place], [place] [address]  Reason for referral: bilateral swollen legs.   History of Present Illness  Ashley Morse is a 64 y.o. (07/05/1950) female who presents with chief complaint: bilateral leg swelling and varicose veins. Her left leg is worse than the right. The patient has had varicose veins and swelling for several decades now. She reports having undergone "vein stripping" twice in both legs in [place]. She complains of her legs feeling tired and achy and is worse at the end of the day. She is a tired Engineer, site but remains active. She says that the her leg swelling and pain interferes with her daily activities. She has worn thigh high compression stockings in the past with minimal relief. She also elevates her legs. She reports pain in her legs at night which keeps her from sleeping including a shooting pain from her posterior calf down to her foot intermittently. She has a history of back pain and has seen a chiropractor in the past. She has not seen a chiropractor recently. She denies any skin changes, venous bleeds or ulcerations. She denies claudication and rest pain.  She has PMH of STEMI s/p PCI with DES in 2010. She has hyperlipidemia previously on a statin. This was stopped due to increased liver enzymes. She has hypertension managed on an ARB.   Past Medical History  Diagnosis Date  . History of: ST elevation myocardial infarction (STEMI) of inferior wall, subsequent episode of care May 2010    R- PDA occlusion --> PCI with DES  . CAD S/P percutaneous coronary angioplasty May 2010    Distal RPDA: 2 overlapping Promus and DES 2.5 mm x 15 mm, 2.5 mm 23 mm distal to mid RCA. EF 50%. ([place], [place])   . Presence of drug coated stent in right coronary artery May 2010    See above; no longer on DAPT.  [name] Hyperlipidemia     on simvastatin stopped due to increase liver enzyme rechecked back to normal    . Hypertension   . Bilateral venous insufficiency     Bilateral lower extremity edema,significant varicose vein; s/p vein stripping and venous abaltionof greater saph. vein  --> proved to be unsuccessful 2/2 vessel size.; Occasionally wears compression stockings during the wintertime.  cannot tolerate during the summertime;   . Osteoarthritis   . Hypothyroidism     On levothyroxine  . Hemorrhoids     Past Surgical History  Procedure Laterality Date  . Cardiac catheterization  10/23/2008    in [place]- for inferior STEMI, occluded RPDA  . Coronary angioplasty with stent placement  10/23/2008    occulsion of RPDA  -- 2 Promus DES stent overlapping ;2.5 x 15 and 2.5 x 23 mm stents from distal RCA to PDA, ef50%  . Left venous  duplex Left 08/31/2011    Negative for DVT  . Varicose vein surgery Right   . Radiofrequency ablation - left greater saphenous vein: unsuccessful Left 08/31/2011  . Transthoracic echocardiogram  May 2010     Echo, May 2010 - following PCI for inferior STEMI. No wall motion abnormalities, EF of 60%    Social History   Social History  . Marital Status: Married    Spouse Name: N/A  . Number of Children: N/A  . Years of Education: N/A   Occupational History  . Not on file.   Social History Main Topics  . Smoking status:  Never Smoker   . Smokeless tobacco: Never Used  . Alcohol Use: 0.6 oz/week    1 Glasses of wine per week     Comment: Occasionally  . Drug Use: Not on file  . Sexual Activity: Not on file   Other Topics Concern  . Not on file   Social History Narrative   She is a married, mother of one living offspring with one child who died at young age.    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    She is starting to "embrace retirement" & i8 enjoying her free time.      She starting beekeeping and other activities.       She still trying to exercise the gym 2-3 times a week but now is up to 3-4 times a  week in addition to doing walking.       She drinks occasional glass of wine, but if she drinks more not bothered her hemorrhoids.   She does not smoke.   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    Family History  Problem Relation Age of Onset  . Stroke Mother   . Heart disease Mother   . Varicose Veins Mother   . Diabetes Sister   . Lymphoma Daughter     Lymphoma  . Varicose Veins Paternal Grandfather       Current Outpatient Prescriptions on File Prior to Visit  Medication Sig Dispense Refill  . aspirin EC 81 MG tablet Take 81 mg by mouth daily.    . busPIRone (BUSPAR) 5 MG tablet Take 5 mg by mouth 3 (three) times daily as needed.    . Lactobacillus (ACIDOPHILUS PO) Take 1 capsule by mouth daily.    [name] levothyroxine (SYNTHROID, LEVOTHROID) 88 MCG tablet Take 88 mcg by mouth daily before breakfast.    . losartan (COZAAR) 25 MG tablet Take 25 mg by mouth daily.    . Ascorbic Acid (VITAMIN C PO) Take 1 tablet by mouth daily.    [name] b complex vitamins tablet Take 1 tablet by mouth daily.    . Cholecalciferol (VITAMIN D3) 5000 UNITS CAPS Take 1 capsule by mouth daily.    . Coenzyme Q10 200 MG capsule Take 200 mg by mouth daily.    [name] glucosamine-chondroitin 500-400 MG tablet Take 1 tablet by mouth 3 (three) times daily.    . Magnesium 400 MG CAPS Take by mouth.    . Melatonin 1 MG SUBL Place 0.5 tablets under the tongue at bedtime as needed.    . Multiple Vitamins-Minerals (MULTIVITAMIN WITH MINERALS) tablet Take 1 tablet by mouth daily.    . Omega-3 Fatty Acids (FISH OIL PO) Take 1 capsule by mouth daily.    [name] OVER THE COUNTER MEDICATION Bio Flavenoid. Take 1 tablet daily    . vitamin E (VITAMIN E) 400 UNIT capsule Take 400 Units by mouth daily.     No current facility-administered medications on file prior to visit.    Allergies  Allergen Reactions  . Lactose Intolerance (Gi)   . Sulfa Antibiotics Swelling    Skin discoloration    REVIEW OF  SYSTEMS:  (Positives checked otherwise negative)  CARDIOVASCULAR:  []  chest pain, []  chest pressure, []  palpitations, []  shortness of breath when laying flat, []  shortness of breath with exertion,  [x]  pain in legs when walking, [x]  pain in feet when laying flat, []  history  of blood clot in veins (DVT),  history of phlebitis,  swelling in legs,  varicose veins  PULMONARY:   productive cough,  asthma,  wheezing  NEUROLOGIC:   weakness in arms or legs,  numbness in arms or legs,  difficulty speaking or slurred speech,  temporary loss of vision in one eye,  dizziness  HEMATOLOGIC:   bleeding problems,  problems with blood clotting too easily  MUSCULOSKEL:   joint pain,  joint swelling  GASTROINTEST:   vomiting blood,  blood in stool     GENITOURINARY:   burning with urination,  blood in urine  PSYCHIATRIC:   history of major depression  INTEGUMENTARY:   rashes,  ulcers  CONSTITUTIONAL:   fever,  chills   Physical Examination Filed Vitals:   04/01/15 1328  BP: 117/82  Pulse: 74  Temp: 98 F (36.7 C)  TempSrc: Oral  Resp: 16  Height:  (1.676 m)  Weight: 169 lb (76.658 kg)  SpO2: 98%   Body mass index is 27.29 kg/(m^2).  General: A&O x 3, WDWN in NAD  Head: [place]/AT  Neck: Supple  Pulmonary: Sym exp, good air movt, CTAB, no rales, rhonchi, no carotid bruits  Cardiac: RRR, Nl S1, S2, no Murmurs, rubs or gallops  Vascular: palpable radial pulses bilaterally. Palpable left dorsalis pedis pulse. Faintly palpable right dorsalis pedis pulse. No ischemic changes.   Musculoskeletal: Prominent tortuous varicose veins of lower legs bilaterally. Extensive varicosities in feet. Bilateral lower leg swelling. No ulcers. No venous stasis changes.  Neurologic: No focal deficits  Psychiatric: Judgment intact, Mood & affect appropriate for pt's clinical situation  Dermatologic: See M/S exam for extremity exam, no rashes otherwise  noted   Non-Invasive Vascular Imaging  BLE Venous Insufficiency Duplex (Date: 04/01/2015):   RLE: negative for DVT, GSV from proximal thigh to SFJ not visualized (previously stripped), large tortuous perforator veins throughout remainder of leg with diameters 0,87 cm to 1.0 cm, deep venous reflux present.   LLE: negative for DVT, reflux present in great GSV with diameters of 1.1 cm at knee to 1.2 at [facility]. Deep venous reflux present.   Medical Decision Making  Ashley Morse is a 64 y.o. female who presents with: bilateral lower extremity chronic venous insufficiency (C3), left more symptomatic than right.    Based on the patient's history and examination, I recommend maximal medical management with thigh high 20-30 mm compression stockings, leg elevation and ibuprofen.   I discussed with the patient the use of her compression stockings and the need for a three month trial such.  If she fails conservative therapy, will recommend laser ablation of the left great saphenous vein and stab phlebectomy. After she recovers from her left leg procedures, will discuss options for her right leg.   Discussed that her dilated great saphenous vein and perforator branches are not suitable for bypasses if she were to ever need coronary artery bypass.   The shooting pain of her posterior left leg and foot is unlikely related to venous insufficiency but rather of neurologic etiology. She does have a history of back pain and has seen a chiropractor in the past.   The patient will follow up in 3 months with Dr. Hart Rochester or Dr. Arbie Cookey for further discussion.    Maris Berger, PA-C [facility] Office: [phone] Pager: [phone]  04/01/2015, 2:15 PM  This patient was seen and examined in conjunction with Dr. Myra Gianotti I agree with the above.  I have seen and  evaluated the patient.  She has bilateral symptomatic venous insufficiency with symptoms consistent of swelling  and pain and aching, which get worse at the end of the day.  She does were compression stockings but has not during the warmer months.  Her symptoms have been getting worse.  She has a history of venous intervention in the past.  She states that she had vein stripping, however I cannot find the scars that would corroborate this.  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  On the right there are multiple perforators with large tortuous branches.  Maximum diameter of the saphenofemoral junction 1.3 cm.  I discussed with the patient that I would evaluate and address her left leg first since this is the more symptomatic leg.  In addition, the great saphenous vein is most easily identified on ultrasound here.  It does have significant reflux.  She will undergo a trial of 20-30 thigh-high compression stockings and follow-up in 3 months.  At that time we will discuss endovenous laser ablation of the left great saphenous vein and stab phlebectomy of the prominent varicosities.  Durene CalWells Jeni Duling

## 2015-04-15 HISTORY — PX: COLONOSCOPY: SHX174

## 2015-06-25 ENCOUNTER — Encounter: Payer: Self-pay | Admitting: Vascular Surgery

## 2015-06-26 DIAGNOSIS — E039 Hypothyroidism, unspecified: Secondary | ICD-10-CM | POA: Insufficient documentation

## 2015-07-02 ENCOUNTER — Other Ambulatory Visit: Payer: Self-pay | Admitting: *Deleted

## 2015-07-02 ENCOUNTER — Encounter: Payer: Self-pay | Admitting: Vascular Surgery

## 2015-07-02 ENCOUNTER — Ambulatory Visit (INDEPENDENT_AMBULATORY_CARE_PROVIDER_SITE_OTHER): Payer: Medicare Other | Admitting: Vascular Surgery

## 2015-07-02 VITALS — BP 117/74 | HR 89 | Temp 96.5°F | Resp 18 | Ht 65.0 in | Wt 174.0 lb

## 2015-07-02 DIAGNOSIS — I83893 Varicose veins of bilateral lower extremities with other complications: Secondary | ICD-10-CM | POA: Insufficient documentation

## 2015-07-02 NOTE — Progress Notes (Signed)
Problems with Activities of Daily Living Secondary to Leg Pain  1. Ashley Morse states she has had to severely limit exercise due to leg pain.    2. Ashley Morse states any activities that require prolonged standing (cooking , cleaning, shopping) are very difficult due to leg pain.    3. Ashley Morse states that traveling in automobile is very difficult due to leg pain.     Failure of  Conservative Therapy:  1. Worn 20-30 mm Hg thigh high compression hose >3 months with no relief of symptoms.  2. Frequently elevates legs-no relief of symptoms  3. Taken Ibuprofen 600 Mg TID with no relief of symptoms.  Patient resents today for continued discussion of her severe bilateral venous hypertension and varicosities. She was seen by Dr. Myra Gianotti in November. She has an extensive prior history and has extensive difficulty currently with varicosities. She has a history of prior treatment while in [place] years ago. She has had extensive recurrent varicosities bilaterally. Her right formal duplex was reviewed by myself and also I reimaged her legs bilaterally with SonoSite ultrasound. She does have a typical pattern on the left with a large great saphenous vein extending from her proximal calf to her groin with severe reflux. She has huge varicosities throughout her left calf both medial and posterior and anterior. These extend down onto her feet and she has matting of telangiectasia on her feet bilaterally  On the right she does not have any imaged saphenous vein at her proximal thigh. She does have severe refluxing saphenous vein that begins in the mid calf and extends to mid thigh. She again has very large varicosities that are extending over all of these in her right leg.  I feel that she is clearly failed conservative treatment. I did explain treatment option. Left would be ablation of her saphenous vein from her calf to her saphenofemoral junction and right leg with the ablation of her  saphenous vein from mid calf to mid thigh. She would also require staged phlebectomy of greater than 20 varicosities bilaterally. She understands and wished to proceed. We would initially began with laser ablation of her left great saphenous vein. This procedure including slight risk of DVT associated with the procedure

## 2015-07-29 ENCOUNTER — Encounter: Payer: Self-pay | Admitting: Vascular Surgery

## 2015-07-30 ENCOUNTER — Telehealth: Payer: Self-pay | Admitting: Cardiology

## 2015-07-30 NOTE — Telephone Encounter (Signed)
Will not interfere with her current meds.  Make sure she takes with food.

## 2015-07-30 NOTE — Telephone Encounter (Signed)
Routed to [place]ne Terre[place]rist[place], PharmD to review meds

## 2015-07-30 NOTE — Telephone Encounter (Signed)
Returned call to patient, advice relayed. She voiced understanding of instructions.

## 2015-07-30 NOTE — Telephone Encounter (Signed)
Mrs. Ashley Morse is calling to find out if its ok to take 600mg  Ibuprofen 3 times a day for week after her Procedure for Vericourse Veins ( Dr. Arbie CookeyEarly will be doing the procedure) .[name]. Please call    Thanks

## 2015-07-31 ENCOUNTER — Encounter: Payer: Self-pay | Admitting: Vascular Surgery

## 2015-08-01 ENCOUNTER — Encounter: Payer: Self-pay | Admitting: Vascular Surgery

## 2015-08-01 ENCOUNTER — Ambulatory Visit (INDEPENDENT_AMBULATORY_CARE_PROVIDER_SITE_OTHER): Payer: Medicare Other | Admitting: Vascular Surgery

## 2015-08-01 VITALS — BP 145/90 | HR 68 | Temp 97.3°F | Resp 18 | Ht 65.0 in | Wt 174.0 lb

## 2015-08-01 DIAGNOSIS — I83893 Varicose veins of bilateral lower extremities with other complications: Secondary | ICD-10-CM | POA: Diagnosis not present

## 2015-08-01 HISTORY — PX: ENDOVENOUS ABLATION SAPHENOUS VEIN W/ LASER: SUR449

## 2015-08-01 NOTE — Progress Notes (Signed)
Filed Vitals:   08/01/15 0820 08/01/15 0825  BP: 148/93 145/90  Pulse: 68   Temp: 97.3 F (36.3 C)   TempSrc: Oral   Resp: 18   Height: 5\' 5"  (1.651 m)   Weight: 174 lb (78.926 kg)   SpO2: 100%

## 2015-08-01 NOTE — Progress Notes (Signed)
     Laser Ablation Procedure    Date: 08/01/2015   Ashley RamusNancy Morse DOB:11-07-1950  Consent signed: Yes    Surgeon:  Dr. Tawanna Coolerodd Early  Procedure: Laser Ablation: left Greater Saphenous Vein  BP 145/90 mmHg  Pulse 68  Temp(Src) 97.3 F (36.3 C) (Oral)  Resp 18  Ht 5\' 5"  (1.651 m)  Wt 174 lb (78.926 kg)  BMI 28.96 kg/m2  SpO2 100%  Tumescent Anesthesia: 450 cc 0.9% NaCl with 50 cc Lidocaine HCL with 1% Epi and 15 cc 8.4% NaHCO3  Local Anesthesia: 3 cc Lidocaine HCL and NaHCO3 (ratio 2:1)  15 watts continuous mode        Total energy: 2654 Joules   Total time: 2:57      Patient tolerated procedure well    Description of Procedure:  After marking the course of the secondary varicosities, the patient was placed on the operating table in the supine position, and the left leg was prepped and draped in sterile fashion.   Local anesthetic was administered and under ultrasound guidance the saphenous vein was accessed with a micro needle and guide wire; then the mirco puncture sheath was placed.  A guide wire was inserted saphenofemoral junction , followed by a 5 french sheath.  The position of the sheath and then the laser fiber below the junction was confirmed using the ultrasound.  Tumescent anesthesia was administered along the course of the saphenous vein using ultrasound guidance. The patient was placed in Trendelenburg position and protective laser glasses were placed on patient and staff, and the laser was fired at 15 watts continuous mode advancing 1-602mm/second for a total of 2654 joules.      Steri strip were applied ABD pads and thigh high compression stockings were applied.  Ace wrap bandages were applied at the top of the saphenofemoral junction. Blood loss was less than 15 cc.  The patient ambulated out of the operating room having tolerated the procedure well.  Uneventful ablation from just below the knee to the saphenofemoral junction. Patient had a very large somewhat  superficial saphenous vein throughout its course.

## 2015-08-02 ENCOUNTER — Telehealth: Payer: Self-pay | Admitting: *Deleted

## 2015-08-02 NOTE — Telephone Encounter (Signed)
    08/02/2015  Time: 9:47 AM   Patient Name: Ashley RamusNancy Morse  Patient of: T.F. Early  Procedure:Laser Ablation left greater saphenous vein 08-01-2015  Reached patient at home and checked  Her status  Yes    Comments/Actions Taken: [name] states no left leg pain.  Reviewed all post procedural instructions with her.  Reminded her of post laser ablation duplex and VV FU appointment with Dr. Arbie CookeyEarly on 08-08-2015.     @SIGNATURE @

## 2015-08-07 ENCOUNTER — Encounter: Payer: Self-pay | Admitting: Vascular Surgery

## 2015-08-08 ENCOUNTER — Ambulatory Visit (HOSPITAL_COMMUNITY)
Admission: RE | Admit: 2015-08-08 | Discharge: 2015-08-08 | Disposition: A | Discharge status: Home / Self Care | Payer: Medicare Other | Source: Ambulatory Visit | Attending: Vascular Surgery | Admitting: Vascular Surgery

## 2015-08-08 ENCOUNTER — Encounter: Payer: Self-pay | Admitting: Vascular Surgery

## 2015-08-08 ENCOUNTER — Ambulatory Visit (INDEPENDENT_AMBULATORY_CARE_PROVIDER_SITE_OTHER): Payer: Medicare Other | Admitting: Vascular Surgery

## 2015-08-08 VITALS — BP 136/89 | HR 76 | Ht 65.0 in | Wt 173.5 lb

## 2015-08-08 DIAGNOSIS — I83893 Varicose veins of bilateral lower extremities with other complications: Secondary | ICD-10-CM | POA: Diagnosis not present

## 2015-08-08 DIAGNOSIS — I8393 Asymptomatic varicose veins of bilateral lower extremities: Secondary | ICD-10-CM | POA: Insufficient documentation

## 2015-08-08 NOTE — Progress Notes (Signed)
Patient name: Ashley Morse MRN: [id number] DOB: Nov 15, 1950 Sex: female  REASON FOR VISIT: Follow-up of left great saphenous vein ablation 1 week ago  HPI: Ashley Morse Saline is a 65 y.o. female seen today for follow-up. She underwent uneventful ablation from just below the knee to the saphenofemoral junction. She had a very large superficial varicose vein and multiple varicosities arising from this. She does have more than the usual amount of bruising and discomfort associated with procedure related to her very superficial large vein. She has no evidence of skin irritation  Current Outpatient Prescriptions  Medication Sig Dispense Refill  . Ascorbic Acid (VITAMIN C PO) Take 1 tablet by mouth daily.    [name]. aspirin EC 81 MG tablet Take 81 mg by mouth daily.    [name]. b complex vitamins tablet Take 1 tablet by mouth daily.    . busPIRone (BUSPAR) 5 MG tablet Take 5 mg by mouth 3 (three) times daily as needed.    . Cholecalciferol (VITAMIN D3) 5000 UNITS CAPS Take 1 capsule by mouth daily.    . Coenzyme Q10 200 MG capsule Take 200 mg by mouth daily. Reported on 07/02/2015    . glucosamine-chondroitin 500-400 MG tablet Take 1 tablet by mouth 3 (three) times daily.    . Lactobacillus (ACIDOPHILUS PO) Take 1 capsule by mouth daily.    [name]. levothyroxine (SYNTHROID, LEVOTHROID) 88 MCG tablet Take 88 mcg by mouth daily before breakfast.    . losartan (COZAAR) 25 MG tablet Take 25 mg by mouth daily.    . Magnesium 400 MG CAPS Take by mouth.    . Melatonin 1 MG SUBL Place 0.5 tablets under the tongue at bedtime as needed.    . Multiple Vitamins-Minerals (MULTIVITAMIN WITH MINERALS) tablet Take 1 tablet by mouth daily.    . Omega-3 Fatty Acids (FISH OIL PO) Take 1 capsule by mouth daily. Reported on 07/02/2015    . OVER THE COUNTER MEDICATION Bio Flavenoid. Take 1 tablet daily    . vitamin E (VITAMIN E) 400 UNIT capsule Take 400 Units by mouth daily. Reported on 07/02/2015     No current facility-administered  medications for this visit.    REVIEW OF SYSTEMS:  [X]  denotes positive finding, [ ]  denotes negative finding Cardiac  Comments:  Chest pain or chest pressure:    Shortness of breath upon exertion:    Short of breath when lying flat:    Irregular heart rhythm:    Constitutional    Fever or chills:      PHYSICAL EXAM: Filed Vitals:   08/08/15 0926  BP: 136/89  Pulse: 76  Height: 5\' 5"  (1.651 m)  Weight: 173 lb 8 oz (78.699 kg)  SpO2: 99%    GENERAL: The patient is a well-nourished female, in no acute distress. The vital signs are documented above. Does have a firmness and thickening over the course of her great saphenous vein from below the knee on the medial calf up to her proximal thigh. Bruising over this area as well. Does have extensive tributary varicosities in her thigh and calf and extending down onto her foot.  Venous duplex today reveals closure of her great saphenous vein from the distal insertion site in her calf to 1.7 cm from the saphenofemoral junction with no evidence of DVT    MEDICAL ISSUES: Stable status post laser ablation of left great saphenous vein. We will see her again in one week for similar treatment in her right leg. She does have marked  extensive varicosities and I do suspect that she will require a staged stab phlebectomy site as well. We will see her again in one week for right leg treatment  Willies Laviolette [facility] [place] [place] [employer]: [phone]

## 2015-08-12 DIAGNOSIS — E785 Hyperlipidemia, unspecified: Secondary | ICD-10-CM | POA: Insufficient documentation

## 2015-08-12 DIAGNOSIS — M1990 Unspecified osteoarthritis, unspecified site: Secondary | ICD-10-CM | POA: Insufficient documentation

## 2015-08-12 DIAGNOSIS — I10 Essential (primary) hypertension: Secondary | ICD-10-CM | POA: Insufficient documentation

## 2015-08-15 ENCOUNTER — Encounter: Payer: Self-pay | Admitting: Vascular Surgery

## 2015-08-15 ENCOUNTER — Ambulatory Visit (INDEPENDENT_AMBULATORY_CARE_PROVIDER_SITE_OTHER): Payer: Medicare Other | Admitting: Vascular Surgery

## 2015-08-15 VITALS — BP 115/82 | HR 84 | Temp 96.7°F | Resp 18 | Ht 66.0 in | Wt 169.0 lb

## 2015-08-15 DIAGNOSIS — I83893 Varicose veins of bilateral lower extremities with other complications: Secondary | ICD-10-CM | POA: Diagnosis not present

## 2015-08-15 HISTORY — PX: ENDOVENOUS ABLATION SAPHENOUS VEIN W/ LASER: SUR449

## 2015-08-15 NOTE — Progress Notes (Signed)
     Laser Ablation Procedure    Date: 08/15/2015   Ashley RamusNancy Bohorquez DOB:March 20, 1951  Consent signed: Yes    Surgeon:  Dr. Tawanna Coolerodd Early  Procedure: Laser Ablation: right Greater Saphenous Vein  BP 115/82 mmHg  Pulse 84  Temp(Src) 96.7 F (35.9 C) (Oral)  Resp 18  Ht 5\' 6"  (1.676 m)  Wt 169 lb (76.658 kg)  BMI 27.29 kg/m2  SpO2 100%  Tumescent Anesthesia: 300 cc 0.9% NaCl with 50 cc Lidocaine HCL with 1% Epi and 15 cc 8.4% NaHCO3  Local Anesthesia: 2 cc Lidocaine HCL and NaHCO3 (ratio 2:1)  15 watts continuous mode        Total energy: 1303 Joules   Total time: 1:26     Patient tolerated procedure well    Description of Procedure:  After marking the course of the secondary varicosities, the patient was placed on the operating table in the supine position, and the right leg was prepped and draped in sterile fashion.   Local anesthetic was administered and under ultrasound guidance the saphenous vein was accessed with a micro needle and guide wire; then the mirco puncture sheath was placed.  A guide wire was inserted saphenofemoral junction , followed by a 5 french sheath.  The position of the sheath and then the laser fiber below the junction was confirmed using the ultrasound.  Tumescent anesthesia was administered along the course of the saphenous vein using ultrasound guidance. The patient was placed in Trendelenburg position and protective laser glasses were placed on patient and staff, and the laser was fired at 15 watts continuous mode advancing 1-162mm/second for a total of 1303 joules.       Steri strips were applied and ABD pads and thigh high compression stockings were applied.  Ace wrap bandages were applied and at the top of the saphenofemoral junction. Blood loss was less than 15 cc.  The patient ambulated out of the operating room having tolerated the procedure well.  She had ablation of her great saphenous vein where she had multiple tributaries arising from this  from mid calf to mid thigh. Prior upper thigh saphenous vein had been treated in the remote past. Will be seen again in one week

## 2015-08-16 ENCOUNTER — Encounter: Payer: Self-pay | Admitting: Vascular Surgery

## 2015-08-16 ENCOUNTER — Telehealth: Payer: Self-pay | Admitting: *Deleted

## 2015-08-16 NOTE — Telephone Encounter (Signed)
    08/16/2015  Time: 9:31 AM   Patient Name: Ashley RamusNancy Dileonardo  Patient of: T.F. Early  Procedure:Laser Ablation right greater saphenous vein 08-15-2015  Reached patient at home and checked  Her status  Yes    Comments/Actions Taken: [name] states no problems with right leg pain or swelling.  Reviewed post procedural instructions with Mrs. Weigelt and reminded her of post laser ablation duplex and VV follow up with Dr. Arbie CookeyEarly on 08-22-2015.      @SIGNATURE @

## 2015-08-22 ENCOUNTER — Ambulatory Visit (INDEPENDENT_AMBULATORY_CARE_PROVIDER_SITE_OTHER): Payer: Medicare Other | Admitting: Vascular Surgery

## 2015-08-22 ENCOUNTER — Encounter: Payer: Self-pay | Admitting: Vascular Surgery

## 2015-08-22 ENCOUNTER — Ambulatory Visit (HOSPITAL_COMMUNITY)
Admission: RE | Admit: 2015-08-22 | Discharge: 2015-08-22 | Disposition: A | Discharge status: Home / Self Care | Payer: Medicare Other | Source: Ambulatory Visit | Attending: Vascular Surgery | Admitting: Vascular Surgery

## 2015-08-22 VITALS — BP 137/90 | HR 69 | Ht 66.0 in | Wt 171.5 lb

## 2015-08-22 DIAGNOSIS — Z9889 Other specified postprocedural states: Secondary | ICD-10-CM | POA: Insufficient documentation

## 2015-08-22 DIAGNOSIS — E785 Hyperlipidemia, unspecified: Secondary | ICD-10-CM | POA: Diagnosis not present

## 2015-08-22 DIAGNOSIS — I83893 Varicose veins of bilateral lower extremities with other complications: Secondary | ICD-10-CM

## 2015-08-22 DIAGNOSIS — I10 Essential (primary) hypertension: Secondary | ICD-10-CM | POA: Insufficient documentation

## 2015-08-22 NOTE — Progress Notes (Signed)
[facility]  Patient name: Ashley Morse MRN: [id number] DOB: 03/28/1951 Sex: female  REASON FOR VISIT: Follow-up right great saphenous vein ablation  HPI: Ashley Morse is a 65 y.o. female here today for follow-up of her her ablation right great saphenous vein. She had the typical amount of discomfort and bruising associated with this. She is several weeks out from similar treatment in her left leg. She has had the near total resolution of any inflammation or discomfort on the left.  Past Medical History  Diagnosis Date  . History of: ST elevation myocardial infarction (STEMI) of inferior wall, subsequent episode of care May 2010    R- PDA occlusion --> PCI with DES  . CAD S/P percutaneous coronary angioplasty May 2010    Distal RPDA: 2 overlapping Promus and DES 2.5 mm x 15 mm, 2.5 mm 23 mm distal to mid RCA. EF 50%. ([place], [place])   . Presence of drug coated stent in right coronary artery May 2010    See above; no longer on DAPT.  [name] Hyperlipidemia     on simvastatin stopped due to increase liver enzyme rechecked back to normal  . Hypertension   . Bilateral venous insufficiency     Bilateral lower extremity edema,significant varicose vein; s/p vein stripping and venous abaltionof greater saph. vein  --> proved to be unsuccessful 2/2 vessel size.; Occasionally wears compression stockings during the wintertime.  cannot tolerate during the summertime;   . Osteoarthritis   . Hypothyroidism     On levothyroxine  . Hemorrhoids     Family History  Problem Relation Age of Onset  . Stroke Mother   . Heart disease Mother   . Varicose Veins Mother   . Diabetes Sister   . Lymphoma Daughter     Lymphoma  . Varicose Veins Paternal Grandfather     SOCIAL HISTORY: Social History  Substance Use Topics  . Smoking status: Never Smoker   . Smokeless tobacco: Never Used  . Alcohol Use: 0.6 oz/week    1 Glasses of wine per week     Comment:  Occasionally    Allergies  Allergen Reactions  . Benzalkonium Chloride Other (See Comments)    unknown  . Lactose Other (See Comments)    unknown  . Lactose Intolerance (Gi)   . Lisinopril Other (See Comments)    unknown  . Sulfa Antibiotics Swelling    Skin discoloration  . Sulfamethoxazole-Trimethoprim     Other reaction(s): Other (See Comments) unknown    Current Outpatient Prescriptions  Medication Sig Dispense Refill  . Ascorbic Acid (VITAMIN C PO) Take 1 tablet by mouth daily.    [name] aspirin EC 81 MG tablet Take 81 mg by mouth daily.    [name] b complex vitamins tablet Take 1 tablet by mouth daily.    . busPIRone (BUSPAR) 5 MG tablet Take 5 mg by mouth 3 (three) times daily as needed.    . Cholecalciferol (VITAMIN D3) 5000 UNITS CAPS Take 1 capsule by mouth daily.    . Coenzyme Q10 200 MG capsule Take 200 mg by mouth daily. Reported on 07/02/2015    . glucosamine-chondroitin 500-400 MG tablet Take 1 tablet by mouth 3 (three) times daily.    . Lactobacillus (ACIDOPHILUS PO) Take 1 capsule by mouth daily.    [name] levothyroxine (SYNTHROID, LEVOTHROID) 88 MCG tablet Take 88 mcg by mouth daily before breakfast.    . losartan (COZAAR) 25 MG tablet Take 25 mg by  mouth daily.    . Magnesium 400 MG CAPS Take by mouth.    . Melatonin 1 MG SUBL Place 0.5 tablets under the tongue at bedtime as needed.    . Multiple Vitamins-Minerals (MULTIVITAMIN WITH MINERALS) tablet Take 1 tablet by mouth daily.    . Omega-3 Fatty Acids (FISH OIL PO) Take 1 capsule by mouth daily. Reported on 07/02/2015    . OVER THE COUNTER MEDICATION Bio Flavenoid. Take 1 tablet daily    . vitamin E (VITAMIN E) 400 UNIT capsule Take 400 Units by mouth daily. Reported on 07/02/2015     No current facility-administered medications for this visit.    REVIEW OF SYSTEMS:   denotes positive finding,  denotes negative finding Cardiac  Comments:  Chest pain or chest pressure:    Shortness of breath upon exertion:      Short of breath when lying flat:    Irregular heart rhythm:        Vascular    Pain in calf, thigh, or hip brought on by ambulation:    Pain in feet at night that wakes you up from your sleep:     Blood clot in your veins:    Leg swelling:         Pulmonary    Oxygen at home:    Productive cough:     Wheezing:         Neurologic    Sudden weakness in arms or legs:     Sudden numbness in arms or legs:     Sudden onset of difficulty speaking or slurred speech:    Temporary loss of vision in one eye:     Problems with dizziness:         Gastrointestinal    Blood in stool:     Vomited blood:         Genitourinary    Burning when urinating:     Blood in urine:        Psychiatric    Major depression:         Hematologic    Bleeding problems:    Problems with blood clotting too easily:        Skin    Rashes or ulcers:        Constitutional    Fever or chills:      PHYSICAL EXAM: Filed Vitals:   08/22/15 0939  BP: 137/90  Pulse: 69  Height:  (1.676 m)  Weight: 171 lb 8 oz (77.792 kg)  SpO2: 98%    GENERAL: The patient is a well-nourished female, in no acute distress. The vital signs are documented above. VASCULAR: Palpable dorsalis pedis pulses bilaterally PULMONARY: There is good air exchange   MUSCULOSKELETAL: There are no major deformities or cyanosis. NEUROLOGIC: No focal weakness or paresthesias are detected. SKIN: There are no ulcers or rashes noted. PSYCHIATRIC: The patient has a normal affect. Bruising around the medial aspect of the right knee. Easily palpable thrombosed great saphenous vein and the subcutaneous tissue   DATA:  Duplex today reveals closure of her great saphenous vein from the mid calf up through the mid thigh. She had a prior stripping from this portion to the saphenofemoral junction. No evidence of DVT  MEDICAL ISSUES: Stable one week following ablation of her right great saphenous vein and also follow-up of prior treatment  of her left great saphenous vein. She does have marked tributary varicosities most particularly in her left calf. She reports these  do have some improvement. She will continue wearing her compression garments and we will see her in 3 months to determine if stab phlebectomy are warranted for further symptom relief.    Gretta BeganEarly, Rodger Giangregorio [facility] [place] [employer]: [phone]

## 2015-09-02 DIAGNOSIS — I809 Phlebitis and thrombophlebitis of unspecified site: Secondary | ICD-10-CM | POA: Insufficient documentation

## 2015-09-02 DIAGNOSIS — F4002 Agoraphobia without panic disorder: Secondary | ICD-10-CM | POA: Insufficient documentation

## 2015-09-02 DIAGNOSIS — H9319 Tinnitus, unspecified ear: Secondary | ICD-10-CM | POA: Insufficient documentation

## 2015-09-02 DIAGNOSIS — H905 Unspecified sensorineural hearing loss: Secondary | ICD-10-CM | POA: Insufficient documentation

## 2015-09-02 DIAGNOSIS — M25561 Pain in right knee: Secondary | ICD-10-CM | POA: Insufficient documentation

## 2015-11-15 ENCOUNTER — Encounter: Payer: Self-pay | Admitting: Vascular Surgery

## 2015-11-26 ENCOUNTER — Encounter: Payer: Self-pay | Admitting: Vascular Surgery

## 2015-11-26 ENCOUNTER — Ambulatory Visit (INDEPENDENT_AMBULATORY_CARE_PROVIDER_SITE_OTHER): Payer: Medicare Other | Admitting: Vascular Surgery

## 2015-11-26 VITALS — BP 128/87 | HR 78 | Temp 98.3°F | Resp 18 | Ht 65.0 in | Wt 173.4 lb

## 2015-11-26 DIAGNOSIS — I83893 Varicose veins of bilateral lower extremities with other complications: Secondary | ICD-10-CM

## 2015-11-26 NOTE — Progress Notes (Signed)
[facility] [place]  Patient name: Ashley Morse Patient MRN: [id number] DOB: 20-Jan-1951 Sex: female  REASON FOR VISIT: Follow-up lower extremity venous hypertension.  HPI: Ashley Morse is a 65 y.o. female here today follow-up of bilateral [place] venous hypertension. She is status post staged bilateral laser ablation of great saphenous vein. She reports significant relief in her aching and painful sensation with prolonged standing. She does continue to have discomfort specifically over the varicosities in both lower extremities. These are more extensive on her right than on her left. She has marked telangiectasia of both lower extremities extending into her feet bilaterally. Pain associated with the telangiectasia  Past Medical History  Diagnosis Date  . History of: ST elevation myocardial infarction (STEMI) of inferior wall, subsequent episode of care May 2010    R- PDA occlusion --> PCI with DES  . CAD S/P percutaneous coronary angioplasty May 2010    Distal RPDA: 2 overlapping Promus and DES 2.5 mm x 15 mm, 2.5 mm 23 mm distal to mid RCA. EF 50%. ([place]   . Presence of drug coated stent in right coronary artery May 2010    See above; no longer on DAPT.  [name]. Hyperlipidemia     on simvastatin stopped due to increase liver enzyme rechecked back to normal  . Hypertension   . Bilateral venous insufficiency     Bilateral lower extremity edema,significant varicose vein; s/p vein stripping and venous abaltionof greater saph. vein  --> proved to be unsuccessful 2/2 vessel size.; Occasionally wears compression stockings during the wintertime.  cannot tolerate during the summertime;   . Osteoarthritis   . Hypothyroidism     On levothyroxine  . Hemorrhoids   . Varicose veins     Family History  Problem Relation Age of Onset  . Stroke Mother   . Heart disease Mother   . Varicose Veins Mother   . Diabetes Sister   .  Lymphoma Daughter     Lymphoma  . Varicose Veins Paternal Grandfather     SOCIAL HISTORY: Social History  Substance Use Topics  . Smoking status: Never Smoker   . Smokeless tobacco: Never Used  . Alcohol Use: 0.6 oz/week    1 Glasses of wine per week     Comment: Occasionally    Allergies  Allergen Reactions  . Benzalkonium Chloride Other (See Comments)    unknown  . Lactose Other (See Comments)    unknown  . Lactose Intolerance (Gi)   . Lisinopril Other (See Comments)    unknown  . Sulfa Antibiotics Swelling    Skin discoloration  . Sulfamethoxazole-Trimethoprim     Other reaction(s): Other (See Comments) unknown    Current Outpatient Prescriptions  Medication Sig Dispense Refill  . Ascorbic Acid (VITAMIN C PO) Take 1 tablet by mouth daily.    [name]. aspirin EC 81 MG tablet Take 81 mg by mouth daily.    [name]. b complex vitamins tablet Take 1 tablet by mouth daily.    . busPIRone (BUSPAR) 5 MG tablet Take 5 mg by mouth 3 (three) times daily as needed.    . Cholecalciferol (VITAMIN D3) 5000 UNITS CAPS Take 1 capsule by mouth daily.    . Coenzyme Q10 200 MG capsule Take 200 mg by mouth daily. Reported on 07/02/2015    . glucosamine-chondroitin 500-400 MG tablet Take 1 tablet by mouth 3 (three) times daily.    [name]. levothyroxine (SYNTHROID, LEVOTHROID) 88 MCG tablet Take 88 mcg by  mouth daily before breakfast.    . losartan (COZAAR) 25 MG tablet Take 25 mg by mouth daily.    . Magnesium 400 MG CAPS Take by mouth.    . Melatonin 1 MG SUBL Place 0.5 tablets under the tongue at bedtime as needed.    . Multiple Vitamins-Minerals (MULTIVITAMIN WITH MINERALS) tablet Take 1 tablet by mouth daily.    [name] OVER THE COUNTER MEDICATION Bio Flavenoid. Take 1 tablet daily    . Lactobacillus (ACIDOPHILUS PO) Take 1 capsule by mouth daily. Reported on 11/26/2015    . Omega-3 Fatty Acids (FISH OIL PO) Take 1 capsule by mouth daily. Reported on 11/26/2015    . vitamin E (VITAMIN E) 400 UNIT capsule Take  400 Units by mouth daily. Reported on 11/26/2015     No current facility-administered medications for this visit.    REVIEW OF SYSTEMS:  [X]  denotes positive finding, [ ]  denotes negative finding Cardiac  Comments:  Chest pain or chest pressure:    Shortness of breath upon exertion:    Short of breath when lying flat:    Irregular heart rhythm:        Vascular    Pain in calf, thigh, or hip brought on by ambulation:    Pain in feet at night that wakes you up from your sleep:  x   Blood clot in your veins:    Leg swelling:  x       Pulmonary    Oxygen at home:    Productive cough:     Wheezing:         Neurologic    Sudden weakness in arms or legs:     Sudden numbness in arms or legs:     Sudden onset of difficulty speaking or slurred speech:    Temporary loss of vision in one eye:     Problems with dizziness:         Gastrointestinal    Blood in stool:     Vomited blood:         Genitourinary    Burning when urinating:     Blood in urine:        Psychiatric    Major depression:         Hematologic    Bleeding problems:    Problems with blood clotting too easily:        Skin    Rashes or ulcers:        Constitutional    Fever or chills:      PHYSICAL EXAM: Filed Vitals:   11/26/15 1056  BP: 128/87  Pulse: 78  Temp: 98.3 F (36.8 C)  TempSrc: Oral  Resp: 18  Height: 5\' 5"  (1.651 m)  Weight: 173 lb 6.4 oz (78.654 kg)  SpO2: 96%    GENERAL: The patient is a well-nourished female, in no acute distress. The vital signs are documented above. Vascular: Palpable dorsalis pedis pulses bilaterally PULMONARY: There is good air exchange   MUSCULOSKELETAL: There are no major deformities or cyanosis. NEUROLOGIC: No focal weakness or paresthesias are detected. SKIN: Marked bilateral lower extremity telangiectasia and varicosities PSYCHIATRIC: The patient has a normal affect.  I did image her veins with SonoSite ultrasound this shows her of her saphenous vein  bilaterally  MEDICAL ISSUES: Successful bilateral laser ablation. Does continue to have discomfort directly over varicosities. Explain the option of stab phlebectomy for symptom relief. She wishes to proceed with this after the heat of the summer due  to her difficulty wearing stockings with heat. We'll schedule this at her convenience    Larina Earthly, MD [facility] Tel [phone] Pager [phone]

## 2016-01-14 ENCOUNTER — Encounter: Payer: Self-pay | Admitting: Vascular Surgery

## 2016-01-21 ENCOUNTER — Telehealth: Payer: Self-pay | Admitting: *Deleted

## 2016-01-21 NOTE — Telephone Encounter (Signed)
Following up with Mrs. Alford. She cancelled office surgery (stab phlebectomy staged, bilateral) for 01-23-2016, 02-06-2016 with Dr. Arbie CookeyEarly.  C[facility] HatterRegina Seymour researched whether Medicare has any specifications that stab phlebectomy had to be done within 6 months of endovenous laser ablation.  Shared  Regina's findings with Mrs. Sciulli that no Medicare specifications were found that stab phlebectomy had to be done within 6 months of endovenous laser ablation. [name] states she will defer stab phlebectomy at this time.  States she wants to pursue sclerotherapy but since insurance carriers consider sclerotherapy cosmetic and won't cover the costs of sclerotherapy she will defer that as well.

## 2016-01-23 ENCOUNTER — Encounter: Payer: Medicare Other | Admitting: Vascular Surgery

## 2016-01-28 ENCOUNTER — Ambulatory Visit (INDEPENDENT_AMBULATORY_CARE_PROVIDER_SITE_OTHER): Payer: Medicare Other | Admitting: Cardiology

## 2016-01-28 ENCOUNTER — Encounter: Payer: Self-pay | Admitting: Cardiology

## 2016-01-28 VITALS — BP 120/88 | HR 65 | Ht 66.0 in | Wt 172.8 lb

## 2016-01-28 DIAGNOSIS — Z9861 Coronary angioplasty status: Secondary | ICD-10-CM | POA: Diagnosis not present

## 2016-01-28 DIAGNOSIS — E785 Hyperlipidemia, unspecified: Secondary | ICD-10-CM

## 2016-01-28 DIAGNOSIS — I83893 Varicose veins of bilateral lower extremities with other complications: Secondary | ICD-10-CM | POA: Diagnosis not present

## 2016-01-28 DIAGNOSIS — I10 Essential (primary) hypertension: Secondary | ICD-10-CM

## 2016-01-28 DIAGNOSIS — I2510 Atherosclerotic heart disease of native coronary artery without angina pectoris: Secondary | ICD-10-CM

## 2016-01-28 DIAGNOSIS — R000 Tachycardia, unspecified: Secondary | ICD-10-CM | POA: Diagnosis not present

## 2016-01-28 DIAGNOSIS — I252 Old myocardial infarction: Secondary | ICD-10-CM

## 2016-01-28 NOTE — Patient Instructions (Signed)
Your physician wants you to follow-up in: 1 year with Dr. Harding. You will receive a reminder letter in the mail two months in advance. If you don't receive a letter, please call our office to schedule the follow-up appointment. If you need a refill on your cardiac medications before your next appointment, please call your pharmacy.  

## 2016-01-28 NOTE — Progress Notes (Signed)
PCP: Mady GemmaKAPLAN,KRISTEN, PA-C  Clinic Note: Chief Complaint  Patient presents with  . 1 year exam    pt c/o swelling in legs/feet/ankles--normal per pt; occasional dizziness; pain/cramping in legs--varicose veins   HPI: Ashley Morse is a 65 y.o. female with a PMH below who presents today for annual f/u of CAD, palpitations & venous insufficiency PROBLEM LIST  1. CAD S/P percutaneous coronary angioplasty - 2 overlapping  Promus element DES in RPDA; in setting of acute inferior STEMI   2. History of acute inferior wall MI   3. Essential hypertension   4. Symptomatic varicose veins of both lower extremities   5. Hyperlipidemia with target LDL less than 70; intolerant of statin in the past due to elevated LFTs   6. Rapid heart beat     Ashley Morse was last seen in Sept. 2016- no complaints;  Referred to Dr Arbie CookeyEarly- DISCUSS VARICOSE VEINS -- status post bilateral laser ablation of great saphenous vein. Noted significant relief of aching. Still has some discomfort with varicosities of the lower 70s. Right greater than left.  Recent Hospitalizations: n/a  Studies Reviewed: n/a  Interval History: Ashley Morse seems to doing pretty well overall from a cardiac standpoint. She has had a few episodes of heart racing that lasted maybe 10-15 minutes, but only a couple times in the last year they are all self limiting.- 1 x Dec, March, Aug.  She does note that she feels a little bit weak, tired and dizzy when these episodes occur. She describes as a sensation of vibration in her chest. She's not had any syncope or near-syncope associated with them, and they do usually break spontaneously.  They're usually occur at times that she may be a little bit dehydrated. Otherwise very stable from a cardiac standpoint. No active anginal symptoms with rest or exertion. No CHF symptoms of PND, orthopnea or edema.  Cardiovascular ROS: no chest pain or dyspnea on exertion positive for - palpitations, rapid heart rate  and near syncope - total of ~3-4 in last yr negative for - irregular heartbeat, orthopnea, paroxysmal nocturnal dyspnea, shortness of breath or no edema - but stable Varicose veins   Her PCP stopped her statin because of cramping sometime over the last year. She has had venous ablation, and no symptoms her leg discomfort is notably improved. The swelling is also improved. She still has some aching from tender small varicose veins and telangiectasias. She only wears a support hose during the winter months. As result, she's been reluctant to do the stab ablations of the telangiectasias. She simply just can't tolerate wearing the support stockings during the summer.   Past Medical History:  Diagnosis Date  . Bilateral venous insufficiency    Bilateral lower extremity edema,significant varicose vein; s/p vein stripping and venous abaltionof greater saph. vein  --> proved to be unsuccessful 2/2 vessel size.; Occasionally wears compression stockings during the wintertime.  cannot tolerate during the summertime;   . CAD S/P percutaneous coronary angioplasty May 2010   Distal RPDA: 2 overlapping Promus and DES 2.5 mm x 15 mm, 2.5 mm 23 mm distal to mid RCA. EF 50%. ([place], [place]   . Hemorrhoids   . History of: ST elevation myocardial infarction (STEMI) of inferior wall, subsequent episode of care May 2010   R- PDA occlusion --> PCI with DES  . Hyperlipidemia    on simvastatin stopped due to increase liver enzyme rechecked back to normal  . Hypertension   . Hypothyroidism  On levothyroxine  . Osteoarthritis   . Presence of drug coated stent in right coronary artery May 2010   See above; no longer on DAPT.  [name] Varicose veins     Past Surgical History:  Procedure Laterality Date  . CARDIAC CATHETERIZATION  10/23/2008   in [place]- for inferior STEMI, occluded RPDA  . CORONARY ANGIOPLASTY WITH STENT PLACEMENT  10/23/2008   occulsion of RPDA  -- 2 Promus DES stent overlapping ;2.5 x 15  and 2.5 x 23 mm stents from distal RCA to PDA, ef50%  . ENDOVENOUS ABLATION SAPHENOUS VEIN W/ LASER Left 08-01-2015    endovenous laser ablation left greater saphenous vein by Ashley Began MD  . ENDOVENOUS ABLATION SAPHENOUS VEIN W/ LASER Right 08-15-2015   endovenous laser ablation right greater saphenous vein by Ashley Began MD  . left venous  duplex Left 08/31/2011   Negative for DVT  . Radiofrequency Ablation - Left Greater Saphenous Vein: Unsuccessful Left 08/31/2011  . TRANSTHORACIC ECHOCARDIOGRAM  May 2010    Echo, May 2010 - following PCI for inferior STEMI. No wall motion abnormalities, EF of 60%  . VARICOSE VEIN SURGERY Right     ROS: A comprehensive was performed. Review of Systems  Constitutional: Negative for malaise/fatigue.  HENT: Negative for nosebleeds.   Respiratory: Negative for wheezing.   Cardiovascular: Negative for claudication.  Gastrointestinal: Positive for blood in stool. Negative for melena.       Occasional hemorrhoidal bleed - has Colonoscopy scheduled & GI f/u  Genitourinary: Negative for hematuria.  Neurological: Positive for dizziness (see HPI). Negative for weakness.  Endo/Heme/Allergies: Does not bruise/bleed easily.  All other systems reviewed and are negative.  Prior to Admission medications   Medication Sig Start Date Taking? Authorizing Provider  Ascorbic Acid (VITAMIN C PO) Take 1 tablet by mouth daily.  Yes Historical Provider, MD  aspirin EC 81 MG tablet Take 81 mg by mouth daily.  Yes Historical Provider, MD  b complex vitamins tablet Take 1 tablet by mouth daily.  Yes Historical Provider, MD  busPIRone (BUSPAR) 5 MG tablet Take 5 mg by mouth 3 (three) times daily as needed. 01/17/15 Yes Historical Provider, MD  Cholecalciferol (VITAMIN D3) 5000 UNITS CAPS Take 1 capsule by mouth daily.  Yes Historical Provider, MD  Coenzyme Q10 200 MG capsule Take 200 mg by mouth daily. Reported on 07/02/2015  Yes Historical Provider, MD  glucosamine-chondroitin  500-400 MG tablet Take 1 tablet by mouth 3 (three) times daily.  Yes Historical Provider, MD  Lactobacillus (ACIDOPHILUS PO) Take 1 capsule by mouth daily. Reported on 11/26/2015  Yes Historical Provider, MD  levothyroxine (SYNTHROID, LEVOTHROID) 88 MCG tablet Take 88 mcg by mouth daily before breakfast.  Yes Historical Provider, MD  losartan (COZAAR) 25 MG tablet Take 25 mg by mouth daily.  Yes Historical Provider, MD  Magnesium 400 MG CAPS Take by mouth.  Yes Historical Provider, MD  Melatonin 1 MG SUBL Place 0.5 tablets under the tongue at bedtime as needed.  Yes Historical Provider, MD  Multiple Vitamins-Minerals (MULTIVITAMIN WITH MINERALS) tablet Take 1 tablet by mouth daily.  Yes Historical Provider, MD  Omega-3 Fatty Acids (FISH OIL PO) Take 1 capsule by mouth daily. Reported on 11/26/2015  Yes Historical Provider, MD  OVER THE COUNTER MEDICATION Bio Flavenoid. Take 1 tablet daily  Yes Historical Provider, MD  vitamin E (VITAMIN E) 400 UNIT capsule Take 400 Units by mouth daily. Reported on 11/26/2015  Yes Historical Provider, MD  Allergies  Allergen Reactions  . Benzalkonium Chloride Other (See Comments)    unknown  . Lactose Other (See Comments)    unknown  . Lactose Intolerance (Gi)   . Lisinopril Other (See Comments)    unknown  . Sulfa Antibiotics Swelling    Skin discoloration  . Sulfamethoxazole-Trimethoprim     Other reaction(s): Other (See Comments) unknown    Social History   Social History  . Marital status: Married    Spouse name: N/A  . Number of children: N/A  . Years of education: N/A   Social History Main Topics  . Smoking status: Never Smoker  . Smokeless tobacco: Never Used  . Alcohol use 0.6 oz/week    1 Glasses of wine per week     Comment: Occasionally  . Drug use: Unknown  . Sexual activity: Not Asked   Other Topics Concern  . None   Social History Narrative   She is a married, mother of one living offspring with one child who died at  young age.    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    She is starting to "embrace retirement" & i8 enjoying her free time.      She starting beekeeping and other activities.       She still trying to exercise the gym 2-3 times a week but now is up to 3-4 times a week in addition to doing walking.       She drinks occasional glass of wine, but if she drinks more not bothered her hemorrhoids.   She does not smoke.   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   Family History  Problem Relation Age of Onset  . Stroke Mother   . Heart disease Mother   . Varicose Veins Mother   . Diabetes Sister   . Lymphoma Daughter     Lymphoma  . Varicose Veins Paternal Grandfather     Wt Readings from Last 3 Encounters:  01/28/16 172 lb 12.8 oz (78.4 kg)  11/26/15 173 lb 6.4 oz (78.7 kg)  08/22/15 171 lb 8 oz (77.8 kg)    PHYSICAL EXAM BP 120/88   Pulse 65   Ht 5\' 6"  (1.676 m)   Wt 172 lb 12.8 oz (78.4 kg)   BMI 27.89 kg/m  General: She is a pleasant, healthy appearing woman. NAD, A&O x3, answers questions appropriately. Normal mood and affect.  HEENT: NCAT. She wears glasses. EOMI. MMM. PERRL.  Neck: Supple without LAN, JVD or carotid bruit BUT pulsatile carotid arteries that mask her jugular veins; no JVD  Heart: RRR, normal S1, S2, no M/R/G. Nondisplaced PMI.  Lungs: CTAB, nonlabored, normal effort, good air movement.  Abdomen: Soft/NT/ND/NABS. No HSM.  Extremities: No significant edema but extensive bilateral bulging varicose veins and significant spider veins and purplish discoloration of her feet, left worse than right.; 2+ equal and bounding pulses in bilateral lower extremities. Neuro: Grossly normal.  CN II-XII grossly intact    Adult ECG Report  Rate: 65;  Rhythm: normal sinus rhythm;   Narrative Interpretation: Stable, normal EKG  Other studies Reviewed: Additional studies/ records that were reviewed  today include:  Recent Labs: 12/26/2014 from PCP - no new labs available   ASSESSMENT / PLAN: Problem List Items Addressed This Visit    Symptomatic varicose veins of both lower extremities    Followed now by Dr. Arbie Cookey from vascular  surgery. We'll start wearing her support stockings as he gets cooler. May consider stab ablations of telangiectasias. But is doing better after laser ablation of varicose veins.      Rapid heart beat    I don't really know what these episodes truly are, however it sounds like they may very well be SVT spells. We talked about vagal maneuvers to stop them. Unfortunately she was not able tolerate beta blockers. The only way to get truly diagnose them is with loop recorder, but as they're not causing any significant untoward symptoms, I'm reluctant to pursue loop recorder.      Hyperlipidemia with target LDL less than 70; intolerant of statin in the past due to elevated LFTs (Chronic)    On CoQ10. Labs are monitored by PCP. If she continues to be intolerant of statin, we need to consider fenofibrate or Zetia. If not we can consider possibly trying PCSK9 inhibitors.  I would need to see her labs from her PCP.      History of acute inferior wall MI (Chronic)    No active anginal or heart failure symptoms. Thankfully, her post MI echo did not show any evidence of regional wall motion abnormality. She had 2 overlapping DES stents used to open her 100% occluded  RCA      Essential hypertension (Chronic)    Well-controlled now. Only on losartan.      Relevant Orders   EKG 12-Lead (Completed)   CAD S/P percutaneous coronary angioplasty - 2 overlapping  Promus element DES in RPDA; in setting of acute inferior STEMI - Primary (Chronic)    Greater than 7 years out from her MI with PCI of her RCA. She does not seem to have any significant anginal symptoms. She is active doing gardening and other chores without significant problems.  Plan: Continue aspirin. Not on beta  blocker secondary to bradycardia spells and fatigue. She is intolerant to statins and has been stopped by her PCP. Monitor for symptoms       Other Visit Diagnoses   None.     Current medicines are reviewed at length with the patient today. (+/- concerns) n/a The following changes have been made: n/a Studies Ordered:   Orders Placed This Encounter  Procedures  . EKG 12-Lead   PATIENT INSTRUCTIONS:   Your physician wants you to follow-up in 12 MONTHS WITH DR Lawernce Pitts, M.D., M.S. Interventional Cardiologist   Pager # [phone]

## 2016-01-30 ENCOUNTER — Encounter: Payer: Self-pay | Admitting: Cardiology

## 2016-01-30 NOTE — Assessment & Plan Note (Signed)
Followed now by Dr. Arbie CookeyEarly from vascular surgery. We'll start wearing her support stockings as he gets cooler. May consider stab ablations of telangiectasias. But is doing better after laser ablation of varicose veins.

## 2016-01-30 NOTE — Assessment & Plan Note (Signed)
Well-controlled now. Only on losartan.

## 2016-01-30 NOTE — Assessment & Plan Note (Signed)
I don't really know what these episodes truly are, however it sounds like they may very well be SVT spells. We talked about vagal maneuvers to stop them. Unfortunately she was not able tolerate beta blockers. The only way to get truly diagnose them is with loop recorder, but as they're not causing any significant untoward symptoms, I'm reluctant to pursue loop recorder.

## 2016-01-30 NOTE — Assessment & Plan Note (Signed)
On CoQ10. Labs are monitored by PCP. If she continues to be intolerant of statin, we need to consider fenofibrate or Zetia. If not we can consider possibly trying PCSK9 inhibitors.  I would need to see her labs from her PCP.

## 2016-01-30 NOTE — Assessment & Plan Note (Addendum)
No active anginal or heart failure symptoms. Thankfully, her post MI echo did not show any evidence of regional wall motion abnormality. She had 2 overlapping DES stents used to open her 100% occluded  RCA

## 2016-01-30 NOTE — Assessment & Plan Note (Addendum)
Greater than 7 years out from her MI with PCI of her RCA. She does not seem to have any significant anginal symptoms. She is active doing gardening and other chores without significant problems.  Plan: Continue aspirin. Not on beta blocker secondary to bradycardia spells and fatigue. She is intolerant to statins and has been stopped by her PCP. Monitor for symptoms

## 2016-02-06 ENCOUNTER — Encounter: Payer: Medicare Other | Admitting: Vascular Surgery

## 2016-02-27 ENCOUNTER — Encounter: Payer: Medicare Other | Admitting: Vascular Surgery

## 2016-06-08 ENCOUNTER — Other Ambulatory Visit: Payer: Self-pay | Admitting: Gastroenterology

## 2016-06-08 DIAGNOSIS — B182 Chronic viral hepatitis C: Secondary | ICD-10-CM

## 2016-06-24 DIAGNOSIS — N8111 Cystocele, midline: Secondary | ICD-10-CM | POA: Insufficient documentation

## 2016-06-25 ENCOUNTER — Ambulatory Visit (HOSPITAL_COMMUNITY)
Admission: RE | Admit: 2016-06-25 | Discharge: 2016-06-25 | Disposition: A | Discharge status: Home / Self Care | Payer: Medicare Other | Source: Ambulatory Visit | Attending: Gastroenterology | Admitting: Gastroenterology

## 2016-06-25 DIAGNOSIS — K769 Liver disease, unspecified: Secondary | ICD-10-CM | POA: Diagnosis not present

## 2016-06-25 DIAGNOSIS — B182 Chronic viral hepatitis C: Secondary | ICD-10-CM | POA: Diagnosis not present

## 2016-07-05 ENCOUNTER — Emergency Department (HOSPITAL_COMMUNITY): Payer: Medicare Other

## 2016-07-05 ENCOUNTER — Emergency Department (HOSPITAL_COMMUNITY)
Admission: EM | Admit: 2016-07-05 | Discharge: 2016-07-05 | Disposition: A | Discharge status: Home / Self Care | Payer: Medicare Other | Attending: Emergency Medicine | Admitting: Emergency Medicine

## 2016-07-05 ENCOUNTER — Encounter (HOSPITAL_COMMUNITY): Payer: Self-pay | Admitting: Emergency Medicine

## 2016-07-05 DIAGNOSIS — E039 Hypothyroidism, unspecified: Secondary | ICD-10-CM | POA: Insufficient documentation

## 2016-07-05 DIAGNOSIS — K529 Noninfective gastroenteritis and colitis, unspecified: Secondary | ICD-10-CM

## 2016-07-05 DIAGNOSIS — Z955 Presence of coronary angioplasty implant and graft: Secondary | ICD-10-CM | POA: Insufficient documentation

## 2016-07-05 DIAGNOSIS — Z79899 Other long term (current) drug therapy: Secondary | ICD-10-CM | POA: Insufficient documentation

## 2016-07-05 DIAGNOSIS — I10 Essential (primary) hypertension: Secondary | ICD-10-CM | POA: Diagnosis not present

## 2016-07-05 DIAGNOSIS — I2510 Atherosclerotic heart disease of native coronary artery without angina pectoris: Secondary | ICD-10-CM | POA: Insufficient documentation

## 2016-07-05 DIAGNOSIS — R109 Unspecified abdominal pain: Secondary | ICD-10-CM | POA: Diagnosis present

## 2016-07-05 DIAGNOSIS — Z7982 Long term (current) use of aspirin: Secondary | ICD-10-CM | POA: Insufficient documentation

## 2016-07-05 LAB — URINALYSIS, ROUTINE W REFLEX MICROSCOPIC
Bilirubin Urine: NEGATIVE
Glucose, UA: NEGATIVE mg/dL
KETONES UR: 5 mg/dL — AB
Leukocytes, UA: NEGATIVE
Nitrite: NEGATIVE
PH: 7 (ref 5.0–8.0)
Protein, ur: NEGATIVE mg/dL
Specific Gravity, Urine: 1.016 (ref 1.005–1.030)

## 2016-07-05 LAB — CBC WITH DIFFERENTIAL/PLATELET
Basophils Absolute: 0 10*3/uL (ref 0.0–0.1)
Basophils Relative: 0 %
Eosinophils Absolute: 0 10*3/uL (ref 0.0–0.7)
Eosinophils Relative: 0 %
HCT: 42.2 % (ref 36.0–46.0)
Hemoglobin: 14.1 g/dL (ref 12.0–15.0)
Lymphocytes Relative: 14 %
Lymphs Abs: 2.1 10*3/uL (ref 0.7–4.0)
MCH: 29.9 pg (ref 26.0–34.0)
MCHC: 33.4 g/dL (ref 30.0–36.0)
MCV: 89.4 fL (ref 78.0–100.0)
Monocytes Absolute: 0.9 10*3/uL (ref 0.1–1.0)
Monocytes Relative: 6 %
Neutro Abs: 11.9 10*3/uL — ABNORMAL HIGH (ref 1.7–7.7)
Neutrophils Relative %: 80 %
Platelets: 246 10*3/uL (ref 150–400)
RBC: 4.72 MIL/uL (ref 3.87–5.11)
RDW: 12.5 % (ref 11.5–15.5)
WBC: 15 10*3/uL — ABNORMAL HIGH (ref 4.0–10.5)

## 2016-07-05 LAB — BASIC METABOLIC PANEL
Anion gap: 10 (ref 5–15)
BUN: 13 mg/dL (ref 6–20)
CALCIUM: 9.1 mg/dL (ref 8.9–10.3)
CO2: 23 mmol/L (ref 22–32)
Chloride: 105 mmol/L (ref 101–111)
Creatinine, Ser: 0.75 mg/dL (ref 0.44–1.00)
GFR calc Af Amer: >60 mL/min (ref >60)
Glucose, Bld: 135 mg/dL — ABNORMAL HIGH (ref 65–99)
Potassium: 3.9 mmol/L (ref 3.5–5.1)
SODIUM: 138 mmol/L (ref 135–145)

## 2016-07-05 LAB — POC OCCULT BLOOD, ED: Fecal Occult Bld: POSITIVE — AB

## 2016-07-05 LAB — AMYLASE: AMYLASE: 48 U/L (ref 28–100)

## 2016-07-05 MED ORDER — METRONIDAZOLE 500 MG PO TABS: 500.0000 mg | ORAL_TABLET | Freq: Two times a day (BID) | ORAL | 0 refills | Status: DC

## 2016-07-05 MED ORDER — TRAMADOL HCL 50 MG PO TABS: 50.0000 mg | ORAL_TABLET | Freq: Four times a day (QID) | ORAL | 0 refills | Status: DC | PRN

## 2016-07-05 MED ORDER — CIPROFLOXACIN HCL 500 MG PO TABS: 500.0000 mg | ORAL_TABLET | Freq: Two times a day (BID) | ORAL | 0 refills | Status: DC

## 2016-07-05 MED ORDER — IOPAMIDOL (ISOVUE-300) INJECTION 61%
INTRAVENOUS | Status: AC
  Administered 2016-07-05: 100 mL
  Filled 2016-07-05: qty 100

## 2016-07-05 MED ORDER — IOPAMIDOL (ISOVUE-300) INJECTION 61%
INTRAVENOUS | Status: AC
  Filled 2016-07-05: qty 100

## 2016-07-05 MED ORDER — CIPROFLOXACIN HCL 500 MG PO TABS
500.0000 mg | ORAL_TABLET | Freq: Once | ORAL | Status: AC
  Administered 2016-07-05: 500 mg via ORAL
  Filled 2016-07-05: qty 1

## 2016-07-05 MED ORDER — METRONIDAZOLE 500 MG PO TABS
500.0000 mg | ORAL_TABLET | Freq: Once | ORAL | Status: AC
  Administered 2016-07-05: 500 mg via ORAL
  Filled 2016-07-05: qty 1

## 2016-07-05 NOTE — ED Triage Notes (Signed)
Pt. Stated, I've had lower abdominal pain with some bleeding. I've not been able to eat.

## 2016-07-05 NOTE — Discharge Instructions (Signed)
As discussed, it is important that you follow up as soon as possible with your physician for continued management of your condition. ° °If you develop any new, or concerning changes in your condition, please return to the emergency department immediately. ° °

## 2016-07-05 NOTE — ED Provider Notes (Signed)
[facility]-EMERGENCY DEPT Provider Note   CSN: [id number] Arrival date & time: 07/05/16  7782     History   Chief Complaint Chief Complaint  Patient presents with  . Abdominal Pain  . Rectal Bleeding    HPI Ashley Morse is a 66 y.o. female.  HPI  Patient presents with concern of abdominal pain, bloody stool. Illness began 4 days ago. Notably, the patient was well prior to the onset of symptoms She does have recent podiatric procedure, bunionectomy for which she has been taking Naprosyn, but otherwise no recent medication changes. Since onset of the illness, patient has had abdominal pain, primarily in the left lower quadrant, with nausea, anorexia, and red bloody stool. There was initially some vomiting as well, though this has subsided. No syncope, no chest pain, no dyspnea, no fever. No relief with anything including OTC medication. She has history of prior post delivery uterine artery ligation, failed bladder plexi, otherwise no abdominal surgery.   Past Medical History:  Diagnosis Date  . Bilateral venous insufficiency    Bilateral lower extremity edema,significant varicose vein; s/p vein stripping and venous abaltionof greater saph. vein  --> proved to be unsuccessful 2/2 vessel size.; Occasionally wears compression stockings during the wintertime.  cannot tolerate during the summertime;   . CAD S/P percutaneous coronary angioplasty May 2010   Distal RPDA: 2 overlapping Promus and DES 2.5 mm x 15 mm, 2.5 mm 23 mm distal to mid RCA. EF 50%. ([place], [place])   . Hemorrhoids   . History of: ST elevation myocardial infarction (STEMI) of inferior wall, subsequent episode of care May 2010   R- PDA occlusion --> PCI with DES  . Hyperlipidemia    on simvastatin stopped due to increase liver enzyme rechecked back to normal  . Hypertension   . Hypothyroidism    On levothyroxine  . Osteoarthritis   . Presence of drug coated stent in right coronary artery May 2010   See  above; no longer on DAPT.  [name] Varicose veins     Patient Active Problem List   Diagnosis Date Noted  . Symptomatic varicose veins of both lower extremities 07/02/2015  . Rapid heart beat 01/25/2015  . Osteoarthritis   . Hyperlipidemia with target LDL less than 70; intolerant of statin in the past due to elevated LFTs   . Essential hypertension   . Venous insufficiency of both lower extremities   . History of acute inferior wall MI 09/28/2008  . CAD S/P percutaneous coronary angioplasty - 2 overlapping  Promus element DES in RPDA; in setting of acute inferior STEMI 09/15/2008    Past Surgical History:  Procedure Laterality Date  . CARDIAC CATHETERIZATION  10/23/2008   in [place]- for inferior STEMI, occluded RPDA  . CORONARY ANGIOPLASTY WITH STENT PLACEMENT  10/23/2008   occulsion of RPDA  -- 2 Promus DES stent overlapping ;2.5 x 15 and 2.5 x 23 mm stents from distal RCA to PDA, ef50%  . ENDOVENOUS ABLATION SAPHENOUS VEIN W/ LASER Left 08-01-2015    endovenous laser ablation left greater saphenous vein by Gretta Began MD  . ENDOVENOUS ABLATION SAPHENOUS VEIN W/ LASER Right 08-15-2015   endovenous laser ablation right greater saphenous vein by Gretta Began MD  . left venous  duplex Left 08/31/2011   Negative for DVT  . Radiofrequency Ablation - Left Greater Saphenous Vein: Unsuccessful Left 08/31/2011  . TRANSTHORACIC ECHOCARDIOGRAM  May 2010    Echo, May 2010 - following PCI for inferior STEMI. No wall  motion abnormalities, EF of 60%  . VARICOSE VEIN SURGERY Right     OB History    No data available       Home Medications    Prior to Admission medications   Medication Sig Start Date End Date Taking? Authorizing Provider  Ascorbic Acid (VITAMIN C PO) Take 1 tablet by mouth daily.    Historical Provider, MD  aspirin EC 81 MG tablet Take 81 mg by mouth daily.    Historical Provider, MD  b complex vitamins tablet Take 1 tablet by mouth daily.    Historical Provider, MD  busPIRone  (BUSPAR) 5 MG tablet Take 5 mg by mouth 3 (three) times daily as needed. 01/17/15   Historical Provider, MD  Cholecalciferol (VITAMIN D3) 5000 UNITS CAPS Take 1 capsule by mouth daily.    Historical Provider, MD  Coenzyme Q10 200 MG capsule Take 200 mg by mouth daily. Reported on 07/02/2015    Historical Provider, MD  glucosamine-chondroitin 500-400 MG tablet Take 1 tablet by mouth 3 (three) times daily.    Historical Provider, MD  Lactobacillus (ACIDOPHILUS PO) Take 1 capsule by mouth daily. Reported on 11/26/2015    Historical Provider, MD  levothyroxine (SYNTHROID, LEVOTHROID) 88 MCG tablet Take 88 mcg by mouth daily before breakfast.    Historical Provider, MD  losartan (COZAAR) 25 MG tablet Take 25 mg by mouth daily.    Historical Provider, MD  Magnesium 400 MG CAPS Take by mouth.    Historical Provider, MD  Melatonin 1 MG SUBL Place 0.5 tablets under the tongue at bedtime as needed.    Historical Provider, MD  Multiple Vitamins-Minerals (MULTIVITAMIN WITH MINERALS) tablet Take 1 tablet by mouth daily.    Historical Provider, MD  Omega-3 Fatty Acids (FISH OIL PO) Take 1 capsule by mouth daily. Reported on 11/26/2015    Historical Provider, MD  OVER THE COUNTER MEDICATION Bio Flavenoid. Take 1 tablet daily    Historical Provider, MD  vitamin E (VITAMIN E) 400 UNIT capsule Take 400 Units by mouth daily. Reported on 11/26/2015    Historical Provider, MD    Family History Family History  Problem Relation Age of Onset  . Stroke Mother   . Heart disease Mother   . Varicose Veins Mother   . Diabetes Sister   . Lymphoma Daughter     Lymphoma  . Varicose Veins Paternal Grandfather     Social History Social History  Substance Use Topics  . Smoking status: Never Smoker  . Smokeless tobacco: Never Used  . Alcohol use 0.6 oz/week    1 Glasses of wine per week     Comment: Occasionally     Allergies   Benzalkonium chloride; Lactose; Lactose intolerance (gi); Lisinopril; Sulfa antibiotics; and  Sulfamethoxazole-trimethoprim   Review of Systems Review of Systems  Constitutional:       Per HPI, otherwise negative  HENT:       Per HPI, otherwise negative  Respiratory:       Per HPI, otherwise negative  Cardiovascular:       Per HPI, otherwise negative  Gastrointestinal: Positive for abdominal pain, blood in stool, nausea and vomiting.  Endocrine:       Negative aside from HPI  Genitourinary:       Neg aside from HPI   Musculoskeletal:       Per HPI, otherwise negative  Skin: Negative.   Neurological: Negative for syncope.     Physical Exam Updated Vital Signs BP 101/73 (BP  Location: Left Arm)   Pulse 109   Temp 98.6 F (37 C) (Rectal)   Resp 17   Ht 5\' 7"  (1.702 m)   Wt 170 lb (77.1 kg)   SpO2 99%   BMI 26.63 kg/m   Physical Exam  Constitutional: She is oriented to person, place, and time. She appears well-developed and well-nourished. No distress.  HENT:  Head: Normocephalic and atraumatic.  Eyes: Conjunctivae and EOM are normal.  Cardiovascular: Normal rate and regular rhythm.   Pulmonary/Chest: Effort normal and breath sounds normal. No stridor. No respiratory distress.  Abdominal: She exhibits no distension.    Musculoskeletal: She exhibits no edema.  Neurological: She is alert and oriented to person, place, and time. No cranial nerve deficit.  Skin: Skin is warm and dry.  Psychiatric: She has a normal mood and affect.  Nursing note and vitals reviewed.    ED Treatments / Results  Labs (all labs ordered are listed, but only abnormal results are displayed) Labs Reviewed  BASIC METABOLIC PANEL - Abnormal; Notable for the following:       Result Value   Glucose, Bld 135 (*)    All other components within normal limits  CBC WITH DIFFERENTIAL/PLATELET - Abnormal; Notable for the following:    WBC 15.0 (*)    Neutro Abs 11.9 (*)    All other components within normal limits  URINALYSIS, ROUTINE W REFLEX MICROSCOPIC - Abnormal; Notable for the  following:    APPearance HAZY (*)    Hgb urine dipstick MODERATE (*)    Ketones, ur 5 (*)    Bacteria, UA RARE (*)    Squamous Epithelial / LPF 0-5 (*)    All other components within normal limits  POC OCCULT BLOOD, ED - Abnormal; Notable for the following:    Fecal Occult Bld POSITIVE (*)    All other components within normal limits  AMYLASE   Radiology Ct Abdomen Pelvis W Contrast  Result Date: 07/05/2016 CLINICAL DATA:  LEFT lower quadrant pain, rectal bleeding, hemorrhoids, diarrhea EXAM: CT ABDOMEN AND PELVIS WITH CONTRAST TECHNIQUE: Multidetector CT imaging of the abdomen and pelvis was performed using the standard protocol following bolus administration of intravenous contrast. Sagittal and coronal MPR images reconstructed from axial data set. CONTRAST:  100mL ISOVUE-300 IOPAMIDOL (ISOVUE-300) INJECTION 61% IV. No oral contrast administered. COMPARISON:  None FINDINGS: Lower chest: Lung bases clear Hepatobiliary: Few small hepatic cysts. Liver and gallbladder otherwise normal appearance Pancreas: Normal appearance Spleen: Normal appearance Adrenals/Urinary Tract: Small cysts RIGHT kidney. Adrenal glands, kidneys, ureters and bladder otherwise normal appearance. Stomach/Bowel: Normal appendix. Marked wall thickening of the colon from the distal transverse colon through descending colon compatible with colitis. Moderate pericolic infiltration with small amount of fluid at the LEFT lateral conal fascia. Scattered sigmoid diverticula without sigmoid wall thickening. Stomach and small bowel loops unremarkable for technique. Vascular/Lymphatic: Atherosclerotic calcifications aorta and coronary arteries. Calcified plaque at origin of SMA without significant narrowing. IMA and celiac artery patent. Aorta normal caliber. No adenopathy. Reproductive: Uterus surgically absent.  Normal sized ovaries. Other: No free air or additional ascites.  No definite hernia. Musculoskeletal: Mild osseous  demineralization. IMPRESSION: Colitis involving the distal transverse through descending colon; differential diagnosis includes infection, inflammatory bowel disease, ischemia less likely but not completely excluded. Sigmoid diverticulosis without evidence of diverticulitis. Aortic atherosclerosis. Small hepatic and RIGHT renal cysts. Electronically Signed   By: Ulyses SouthwardMark  Boles M.D.   On: 07/05/2016 12:47    Procedures Procedures (including critical care time)  Medications Ordered in ED Medications  iopamidol (ISOVUE-300) 61 % injection (100 mLs  Contrast Given 07/05/16 1218)  metroNIDAZOLE (FLAGYL) tablet 500 mg (500 mg Oral Given 07/05/16 1336)  ciprofloxacin (CIPRO) tablet 500 mg (500 mg Oral Given 07/05/16 1336)     Initial Impression / Assessment and Plan / ED Course  I have reviewed the triage vital signs and the nursing notes.  Pertinent labs & imaging results that were available during my care of the patient were reviewed by me and considered in my medical decision making (see chart for details).  On re-check the patient is awake and alert.  All findings discussed.  This elderly F p/w LLQ pain and is found to have colitis.  She was heme-occult positive, but there was no e/o hemodynamic compromise.  No substantial exsanguination.  Patient tolerated initial meds well.  She has a GI doc with when she will f/u. No e/o peritonitis, pneumoperitoneum, abscess, bacteremia, sepsis.  Final Clinical Impressions(s) / ED Diagnoses   Final diagnoses:  Colitis    New Prescriptions Discharge Medication List as of 07/05/2016  1:59 PM    START taking these medications   Details  ciprofloxacin (CIPRO) 500 MG tablet Take 1 tablet (500 mg total) by mouth 2 (two) times daily., Starting Sun 07/05/2016, Print    metroNIDAZOLE (FLAGYL) 500 MG tablet Take 1 tablet (500 mg total) by mouth 2 (two) times daily., Starting Sun 07/05/2016, Print    traMADol (ULTRAM) 50 MG tablet Take 1 tablet (50 mg total) by  mouth every 6 (six) hours as needed., Starting Sun 07/05/2016, Print         Gerhard Munch, MD 07/06/16 1002

## 2017-02-03 ENCOUNTER — Encounter: Payer: Self-pay | Admitting: Cardiology

## 2017-02-03 ENCOUNTER — Ambulatory Visit (INDEPENDENT_AMBULATORY_CARE_PROVIDER_SITE_OTHER): Payer: Medicare Other | Admitting: Cardiology

## 2017-02-03 VITALS — BP 123/79 | HR 63 | Ht 66.0 in | Wt 172.6 lb

## 2017-02-03 DIAGNOSIS — I2510 Atherosclerotic heart disease of native coronary artery without angina pectoris: Secondary | ICD-10-CM | POA: Diagnosis not present

## 2017-02-03 DIAGNOSIS — I10 Essential (primary) hypertension: Secondary | ICD-10-CM | POA: Diagnosis not present

## 2017-02-03 DIAGNOSIS — R000 Tachycardia, unspecified: Secondary | ICD-10-CM | POA: Diagnosis not present

## 2017-02-03 DIAGNOSIS — E785 Hyperlipidemia, unspecified: Secondary | ICD-10-CM | POA: Diagnosis not present

## 2017-02-03 DIAGNOSIS — Z9861 Coronary angioplasty status: Secondary | ICD-10-CM

## 2017-02-03 DIAGNOSIS — I252 Old myocardial infarction: Secondary | ICD-10-CM | POA: Diagnosis not present

## 2017-02-03 NOTE — Patient Instructions (Signed)
NO CHANGE WITH CURRENT MEDICATIONS     RECOMMEND IF HEART RATE TRY  "Bearing down"  Coughing  Gagging  Icy, cold towel on face or drink ice cold water    Your physician wants you to follow-up in 12 months with DR Herbie Baltimore.You will receive a reminder letter in the mail two months in advance. If you don't receive a letter, please call our office to schedule the follow-up appointment.   If you need a refill on your cardiac medications before your next appointment, please call your pharmacy.

## 2017-02-03 NOTE — Progress Notes (Signed)
PCP: Richmond Campbell., PA-C -[facility] [place]   Clinic Note: Chief Complaint  Patient presents with  . Follow-up    1 year  . Coronary Artery Disease    PROBLEM LIST NOTED BELOW  PROBLEM LIST  1. CAD S/P percutaneous coronary angioplasty - 2 overlapping Promus element DES in RPDA; in setting of acute inferior STEMI   2. History of acute inferior wall MI   3. Essential hypertension   4. Symptomatic varicose veins of both lower extremities   5. Hyperlipidemia with target LDL less than 70; intolerant of statin in the past due to elevated LFTs   6. Rapid heart beat    HPI: Ashley Morse is a 66 y.o. female with a PMH below who presents today for annual f/u for CAD-PCI, palpitations as well as diffuse LE Venous reflux Dz (has had extensive vein ablation etc prior to living in [place] - while in [place])  Ashley Morse was last seen on   September12th 2017 -  Doing well from a cardiac standpoint. Had noted some rapid heartbeat appetite episodes may be 10-15 minutes. Very rare. Not associated with any syncope or near syncopal  Recent Hospitalizations: n/a  Studies Personally Reviewed - (if available, images/films reviewed: From Epic Chart or Care Everywhere)  none  Interval History:  Ashley Morse returns today overall doing well from a standpoint. Ashley Morse says occasionally Ashley Morse'll feel a little bit of palpitations or short of breath because of increased heat. This will be off and on  For a couple weeks and then has not had any for the last 2 months. Ashley Morse's had a couple episodes lasting maybe 4-5 minutes of palpitations where Ashley Morse's use negative maneuvers and a seem to have helped. Does not last as long and Ashley Morse's less sensitive now less noticeable dizziness. No syncope or near syncope.No TIA/amaurosis fugax symptoms.  No chest pain or shortness of breath with rest or exertion.   No PND or orthopnea. Ashley Morse has  Stable varicose veins but no significant edema. Ashley Morse does wear her support stockings  in the wintertime, but not in the summer.  No claudication.  ROS: A comprehensive was performed. Review of Systems  Constitutional: Negative for malaise/fatigue.  HENT: Negative for congestion, nosebleeds and sinus pain.   Eyes: Negative for blurred vision.  Respiratory: Negative for cough, shortness of breath and wheezing.   Gastrointestinal: Negative for abdominal pain and blood in stool.  Genitourinary: Negative for frequency, hematuria and urgency.  Musculoskeletal: Negative for falls.       Some restless leg type symptoms and leg numbness.  Neurological: Negative for dizziness, focal weakness and loss of consciousness.  Endo/Heme/Allergies: Does not bruise/bleed easily.  Psychiatric/Behavioral: Negative for depression and memory loss. The patient does not have insomnia.   All other systems reviewed and are negative.  I have reviewed and (if needed) personally updated the patient's problem list, medications, allergies, past medical and surgical history, social and family history.   Past Medical History:  Diagnosis Date  . Bilateral venous insufficiency    Bilateral lower extremity edema,significant varicose vein; s/p vein stripping and venous abaltionof greater saph. vein  --> proved to be unsuccessful 2/2 vessel size.; Occasionally wears compression stockings during the wintertime.  cannot tolerate during the summertime;   . CAD S/P percutaneous coronary angioplasty May 2010   Distal RPDA: 2 overlapping Promus and DES 2.5 mm x 15 mm, 2.5 mm 23 mm distal to mid RCA. EF 50%. ([place], [place])   . Hemorrhoids   .  History of: ST elevation myocardial infarction (STEMI) of inferior wall, subsequent episode of care May 2010   R- PDA occlusion --> PCI with DES  . Hyperlipidemia    on simvastatin stopped due to increase liver enzyme rechecked back to normal  . Hypertension   . Hypothyroidism    On levothyroxine  . Osteoarthritis   . Presence of drug coated stent in right  coronary artery May 2010   See above; no longer on DAPT.  [name] Varicose veins     Past Surgical History:  Procedure Laterality Date  . CARDIAC CATHETERIZATION  10/23/2008   in [place]- for inferior STEMI, occluded RPDA  . CORONARY ANGIOPLASTY WITH STENT PLACEMENT  10/23/2008   occulsion of RPDA  -- 2 Promus DES stent overlapping ;2.5 x 15 and 2.5 x 23 mm stents from distal RCA to PDA, ef50%  . ENDOVENOUS ABLATION SAPHENOUS VEIN W/ LASER Left 08-01-2015    endovenous laser ablation left greater saphenous vein by Gretta Began MD  . ENDOVENOUS ABLATION SAPHENOUS VEIN W/ LASER Right 08-15-2015   endovenous laser ablation right greater saphenous vein by Gretta Began MD  . left venous  duplex Left 08/31/2011   Negative for DVT  . Radiofrequency Ablation - Left Greater Saphenous Vein: Unsuccessful Left 08/31/2011  . TRANSTHORACIC ECHOCARDIOGRAM  May 2010    Echo, May 2010 - following PCI for inferior STEMI. No wall motion abnormalities, EF of 60%  . VARICOSE VEIN SURGERY Right     Current Meds  Medication Sig  . Ascorbic Acid (VITAMIN C PO) Take 1 tablet by mouth daily.  [name] aspirin EC 81 MG tablet Take 81 mg by mouth daily.  [name] b complex vitamins tablet Take 1 tablet by mouth daily.  . busPIRone (BUSPAR) 5 MG tablet Take 5 mg by mouth 3 (three) times daily as needed.  . Cholecalciferol (VITAMIN D3) 5000 UNITS CAPS Take 1 capsule by mouth daily.  . Coenzyme Q10 200 MG capsule Take 200 mg by mouth daily. Reported on 07/02/2015  . glucosamine-chondroitin 500-400 MG tablet Take 1 tablet by mouth 3 (three) times daily.  [name] levothyroxine (SYNTHROID, LEVOTHROID) 88 MCG tablet Take 88 mcg by mouth daily before breakfast.  . losartan (COZAAR) 25 MG tablet Take 25 mg by mouth daily.  . Magnesium 400 MG CAPS Take by mouth.  . Melatonin 1 MG SUBL Place 0.5 tablets under the tongue at bedtime as needed.  . metroNIDAZOLE (FLAGYL) 500 MG tablet Take 1 tablet (500 mg total) by mouth 2 (two) times daily.  . Omega-3  Fatty Acids (FISH OIL PO) Take 1 capsule by mouth daily. Reported on 11/26/2015  . OVER THE COUNTER MEDICATION Bio Flavenoid. Take 1 tablet daily  . [DISCONTINUED] ciprofloxacin (CIPRO) 500 MG tablet Take 1 tablet (500 mg total) by mouth 2 (two) times daily.  . [DISCONTINUED] Lactobacillus (ACIDOPHILUS PO) Take 1 capsule by mouth daily. Reported on 11/26/2015  . [DISCONTINUED] Multiple Vitamins-Minerals (MULTIVITAMIN WITH MINERALS) tablet Take 1 tablet by mouth daily.  . [DISCONTINUED] traMADol (ULTRAM) 50 MG tablet Take 1 tablet (50 mg total) by mouth every 6 (six) hours as needed.  . [DISCONTINUED] vitamin E (VITAMIN E) 400 UNIT capsule Take 400 Units by mouth daily. Reported on 11/26/2015    Allergies  Allergen Reactions  . Benzalkonium Chloride Other (See Comments)    unknown  . Lactose Other (See Comments)    unknown  . Lactose Intolerance (Gi)   . Lisinopril Other (See Comments)    unknown  .  Sulfa Antibiotics Swelling    Skin discoloration  . Sulfamethoxazole-Trimethoprim     Other reaction(s): Other (See Comments) unknown    Social History   Social History  . Marital status: Married    Spouse name: N/A  . Number of children: N/A  . Years of education: N/A   Social History Main Topics  . Smoking status: Never Smoker  . Smokeless tobacco: Never Used  . Alcohol use 0.6 oz/week    1 Glasses of wine per week     Comment: Occasionally  . Drug use: Unknown  . Sexual activity: Not Asked   Other Topics Concern  . None   Social History Narrative   Ashley Morse is a married, mother of one living offspring with one child who died at young age.    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    Ashley Morse is starting to "embrace retirement" & i8 enjoying her free time.      Ashley Morse starting beekeeping and other activities.       Ashley Morse still trying to exercise the gym 2-3 times a week but now is up to 3-4 times a week in addition to doing walking.       Ashley Morse  drinks occasional glass of wine, but if Ashley Morse drinks more not bothered her hemorrhoids.   Ashley Morse does not smoke.   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    family history includes Diabetes in her sister; Heart disease in her mother; Lymphoma in her daughter; Stroke in her mother; Varicose Veins in her mother and paternal grandfather.  Wt Readings from Last 3 Encounters:  02/03/17 172 lb 9.6 oz (78.3 kg)  07/05/16 170 lb (77.1 kg)  01/28/16 172 lb 12.8 oz (78.4 kg)    PHYSICAL EXAM BP 123/79   Pulse 63   Ht  (1.676 m)   Wt 172 lb 9.6 oz (78.3 kg)   BMI 27.86 kg/m  Physical Exam  Constitutional: Ashley Morse appears well-developed and well-nourished. No distress.  Well groomed  Neck: Neck supple. No JVD present.  Cardiovascular: Normal rate, regular rhythm, normal heart sounds and intact distal pulses.   Occasional extrasystoles are present. Exam reveals no gallop and no friction rub.   No murmur heard. Pulmonary/Chest: Effort normal and breath sounds normal. No respiratory distress. Ashley Morse has no wheezes. Ashley Morse has no rales.  Abdominal: Soft. Bowel sounds are normal. Ashley Morse exhibits no distension. There is no tenderness. There is no rebound.  Musculoskeletal: Normal range of motion. Ashley Morse exhibits edema (Trivial with significant varicose veins/vitamins and stasis changes).  Neurological: Ashley Morse is alert.  Skin: Skin is warm and dry. Rash (Lower leg venous stasis changes) noted. No erythema.  Bilateral leg bulging varicose veins but it remains with purplish discoloration. Normal pulses.  Psychiatric: Ashley Morse has a normal mood and affect. Her behavior is normal. Judgment and thought content normal.  Nursing note and vitals reviewed.   Adult ECG Report  Rate: 63 ;  Rhythm: normal sinus rhythm and Normal axis, intervals N durations;   Narrative Interpretation:  Normal EKG   Other studies Reviewed: Additional studies/ records that were reviewed today include:  Recent Labs:    Recently checked by  PCP  No results found for: CHOL, HDL, LDLCALC, LDLDIRECT, TRIG, CHOLHDL   ASSESSMENT / PLAN: Problem List Items Addressed This Visit    CAD S/P percutaneous coronary angioplasty - 2 overlapping  Promus element  DES in RPDA; in setting of acute inferior STEMI - Primary (Chronic)     History of inferior stemming of the RCA PCI at that time. Ashley Morse has not any further symptoms whatsoever since. No active anginal symptoms. Ashley Morse has not been on a beta blocker because of bradycardia and fatigue. Ashley Morse is on aspirin and ARB. Ashley Morse is not statin due to intolerance.      Relevant Orders   EKG 12-Lead (Completed)   Essential hypertension (Chronic)     Well controlled on losartan      Relevant Orders   EKG 12-Lead (Completed)   History of acute inferior wall MI (Chronic)     No recurrent angina or heart failure symptoms. Overlapping DES then still occluded RCA without signs of restenosis      Hyperlipidemia with target LDL less than 70; intolerant of statin in the past due to elevated LFTs (Chronic)    No labs available from PCP. ? Statin intolerant - on CoQ10 & Fish Oil -- may need to consider PCSK9-I. Need to see PCP labs.      Rapid heart beat    Rare Sx that sound potentially consistent with PSVT or PAT -- discussed vagal maneuvers.   \Unfortunately, Ashley Morse has been intolerant of beta blockers. Could consider calcium channel blocker if necessary      Relevant Orders   EKG 12-Lead (Completed)      Current medicines are reviewed at length with the patient today. (+/- concerns) n/a The following changes have been made: n/a  Patient Instructions  NO CHANGE WITH CURRENT MEDICATIONS     RECOMMEND IF HEART RATE TRY  "Bearing down"  Coughing  Gagging  Icy, cold towel on face or drink ice cold water    Your physician wants you to follow-up in 12 months with DR Herbie Baltimore.You will receive a reminder letter in the mail two months in advance. If you don't receive a  letter, please call our office to schedule the follow-up appointment.   If you need a refill on your cardiac medications before your next appointment, please call your pharmacy.    Studies Ordered:   Orders Placed This Encounter  Procedures  . EKG 12-Lead      Bryan Lemma, M.D., M.S. Interventional Cardiologist   Pager # [phone] Phone # [phone] [address] [place], [place] [address]

## 2017-02-06 ENCOUNTER — Encounter: Payer: Self-pay | Admitting: Cardiology

## 2017-02-06 NOTE — Assessment & Plan Note (Signed)
Well controlled on losartan. 

## 2017-02-06 NOTE — Assessment & Plan Note (Signed)
Rare Sx that sound potentially consistent with PSVT or PAT -- discussed vagal maneuvers.   \Unfortunately, she has been intolerant of beta blockers. Could consider calcium channel blocker if necessary

## 2017-02-06 NOTE — Assessment & Plan Note (Signed)
No recurrent angina or heart failure symptoms. Overlapping DES then still occluded RCA without signs of restenosis

## 2017-02-06 NOTE — Assessment & Plan Note (Signed)
History of inferior stemming of the RCA PCI at that time. She has not any further symptoms whatsoever since. No active anginal symptoms. She has not been on a beta blocker because of bradycardia and fatigue. She is on aspirin and ARB. She is not statin due to intolerance.

## 2017-02-06 NOTE — Assessment & Plan Note (Signed)
No labs available from PCP. ? Statin intolerant - on CoQ10 & Fish Oil -- may need to consider PCSK9-I. Need to see PCP labs.

## 2017-04-13 ENCOUNTER — Telehealth: Payer: Self-pay | Admitting: Cardiology

## 2017-04-13 NOTE — Telephone Encounter (Signed)
Would be nice to have a tracing for these episodes --  Could consider event monitor of [employer] App for smart phone.  I had mentioned considering Diltiazem (or verapamil) along with Vagal maneuvers -- but would like to know what we are treating. Bryan Lemmaavid Marvelene Stoneberg, MD

## 2017-04-13 NOTE — Telephone Encounter (Signed)
Pt says her heart having been racing a lot,more than usual. When episode pass over,she feels drained and tired.No other symptoms and not having it at this this time.I made an appointment for tomorrow with Dr Edman CircleHarding,please call to evaluate.

## 2017-04-13 NOTE — Telephone Encounter (Signed)
Returned call to patient of Dr. Herbie BaltimoreHarding. She reports her heart racing, more than usual (3-4 times last week, 2-3 times today). She reports fatigue after episodes.   She had an episode today after bending over to clean floor tiles for about 10 minutes. She sat down and rested and her symptoms subsided after a few minutes. She reports she has a prominent artery in her neck that she can see "racing"  She avoids caffeine.   Advised to avoid "trigger" activities and exertional activities between now and her appt tomorrow.   Routed to MD as Lorain ChildesFYI

## 2017-04-14 ENCOUNTER — Ambulatory Visit (INDEPENDENT_AMBULATORY_CARE_PROVIDER_SITE_OTHER): Payer: Medicare Other | Admitting: Cardiology

## 2017-04-14 ENCOUNTER — Encounter: Payer: Self-pay | Admitting: Cardiology

## 2017-04-14 VITALS — BP 122/84 | HR 77 | Ht 66.0 in | Wt 172.0 lb

## 2017-04-14 DIAGNOSIS — R0989 Other specified symptoms and signs involving the circulatory and respiratory systems: Secondary | ICD-10-CM | POA: Diagnosis not present

## 2017-04-14 DIAGNOSIS — I2510 Atherosclerotic heart disease of native coronary artery without angina pectoris: Secondary | ICD-10-CM

## 2017-04-14 DIAGNOSIS — I10 Essential (primary) hypertension: Secondary | ICD-10-CM

## 2017-04-14 DIAGNOSIS — I872 Venous insufficiency (chronic) (peripheral): Secondary | ICD-10-CM | POA: Diagnosis not present

## 2017-04-14 DIAGNOSIS — E785 Hyperlipidemia, unspecified: Secondary | ICD-10-CM | POA: Diagnosis not present

## 2017-04-14 DIAGNOSIS — R000 Tachycardia, unspecified: Secondary | ICD-10-CM

## 2017-04-14 DIAGNOSIS — Z9861 Coronary angioplasty status: Secondary | ICD-10-CM | POA: Diagnosis not present

## 2017-04-14 NOTE — Telephone Encounter (Signed)
Patient has MD OV 04/14/17 to discuss symptoms.

## 2017-04-14 NOTE — Progress Notes (Signed)
PCP: Richmond Campbell., PA-C -[facility] [place]   Clinic Note: Chief Complaint  Patient presents with  . Follow-up    Work in visit for tachypalpitations  . Tachycardia    HPI: Andreina Outten is a 66 y.o. female with a PMH below who presents today for work in evaluation for rapid heartbeat/palpitations spells.  She is called in stating that she was having worsening palpitation episodes and has been brought in to be evaluated. She has a history of known CAD-PCI, palpitations as well as diffuse LE Venous reflux Dz (has had extensive vein ablation etc prior to living in [place] - while in [place])  Elan Mcelvain was last seen on   September 19,2018-  Doing well from a cardiac standpoint. Had noted some rapid heartbeat appetite episodes may be 10-15 minutes. Very rare. Not associated with any syncope or near syncopal  Recent Hospitalizations: n/a  Studies Personally Reviewed - (if available, images/films reviewed: From Epic Chart or Care Everywhere)  none  Interval History:  Ashley Morse is here today to discuss worsening palpitations/irregular heartbeats.  She says that over the last few weeks he has been noticing more frequent episodes of her rapid heart beat spells.  They are now lasting up to 30 minutes at a time.  They are happening several times a week.  She really otherwise not noticing any other cardiac symptoms of chest tightness or pressure associated with these palpitations.  She says that now they are happening as many as 2-3 times a day 3-4 times a week..  Each of her episodes seem to be triggered by something such as having bent over to clean tiles for a while after that she had a prolonged episode.  She had another episode similarly with bending over that triggered an episode.  She has a very prominent carotid artery in the right neck and her aunt (for whom she is a caregiver) noticed this and was quite upset.   Mikki herself, knows that when she feels these palpitations she  usually just sits down to try to relax and and let things calm down on her own and usually they do spontaneously resolve.  She has not had a sensation of passing out.Simply just feels a little bit dizzy when her heart rate races but no syncope or near syncope type symptoms.  She has stable lower extremity edema with no major issues -basically venous stasis varicose vein swelling..  Really pretty well controlled and she does wear support stockings in the wintertime.[name]  No PND, orthopnea.  She does not have any chest tightness or pressure at rest or exertion.  Nothing to suggest any angina or heart failure. No claudication   ROS: A comprehensive was performed. Review of Systems  Constitutional: Negative for malaise/fatigue.  HENT: Negative for congestion, nosebleeds and sinus pain.   Eyes: Negative for blurred vision.  Respiratory: Negative for cough, shortness of breath and wheezing.   Cardiovascular: Positive for palpitations and leg swelling (Venous stasis, varicose veins).  Gastrointestinal: Negative for abdominal pain and blood in stool.  Genitourinary: Negative for frequency and hematuria.  Musculoskeletal: Negative for falls.       Some restless leg type symptoms and leg numbness.  Skin: Negative.   Neurological: Positive for dizziness (With palpitations). Negative for focal weakness and loss of consciousness.  Endo/Heme/Allergies: Does not bruise/bleed easily.  Psychiatric/Behavioral: Negative.   All other systems reviewed and are negative.  I have reviewed and (if needed) personally updated the patient's problem list, medications,  allergies, past medical and surgical history, social and family history.   Past Medical History:  Diagnosis Date  . Bilateral venous insufficiency    Bilateral lower extremity edema,significant varicose vein; s/p vein stripping and venous abaltionof greater saph. vein  --> proved to be unsuccessful 2/2 vessel size.; Occasionally wears compression  stockings during the wintertime.  cannot tolerate during the summertime;   . CAD S/P percutaneous coronary angioplasty May 2010   Distal RPDA: 2 overlapping Promus and DES 2.5 mm x 15 mm, 2.5 mm 23 mm distal to mid RCA. EF 50%. ([place], [place])   . Hemorrhoids   . History of: ST elevation myocardial infarction (STEMI) of inferior wall, subsequent episode of care May 2010   R- PDA occlusion --> PCI with DES  . Hyperlipidemia    on simvastatin stopped due to increase liver enzyme rechecked back to normal  . Hypertension   . Hypothyroidism    On levothyroxine  . Osteoarthritis   . Presence of drug coated stent in right coronary artery May 2010   See above; no longer on DAPT.  [name]. Varicose veins     Past Surgical History:  Procedure Laterality Date  . CARDIAC CATHETERIZATION  10/23/2008   in [place]- for inferior STEMI, occluded RPDA  . CORONARY ANGIOPLASTY WITH STENT PLACEMENT  10/23/2008   occulsion of RPDA  -- 2 Promus DES stent overlapping ;2.5 x 15 and 2.5 x 23 mm stents from distal RCA to PDA, ef50%  . ENDOVENOUS ABLATION SAPHENOUS VEIN W/ LASER Left 08-01-2015    endovenous laser ablation left greater saphenous vein by Gretta Beganodd Early MD  . ENDOVENOUS ABLATION SAPHENOUS VEIN W/ LASER Right 08-15-2015   endovenous laser ablation right greater saphenous vein by Gretta Beganodd Early MD  . left venous  duplex Left 08/31/2011   Negative for DVT  . Radiofrequency Ablation - Left Greater Saphenous Vein: Unsuccessful Left 08/31/2011  . TRANSTHORACIC ECHOCARDIOGRAM  May 2010    Echo, May 2010 - following PCI for inferior STEMI. No wall motion abnormalities, EF of 60%  . VARICOSE VEIN SURGERY Right     No outpatient medications have been marked as taking for the 04/14/17 encounter (Office Visit) with Marykay LexHarding, David W, MD.    Allergies  Allergen Reactions  . Benzalkonium Chloride Other (See Comments)    unknown  . Lactose Other (See Comments)    unknown  . Lactose Intolerance (Gi)   .  Lisinopril Other (See Comments)    unknown  . Sulfa Antibiotics Swelling    Skin discoloration  . Sulfamethoxazole-Trimethoprim     Other reaction(s): Other (See Comments) unknown    Social History   Tobacco Use  . Smoking status: Never Smoker  . Smokeless tobacco: Never Used  Substance Use Topics  . Alcohol use: Yes    Alcohol/week: 0.6 oz    Types: 1 Glasses of wine per week    Comment: Occasionally  . Drug use: Not on file    family history includes Diabetes in her sister; Heart disease in her mother; Lymphoma in her daughter; Stroke in her mother; Varicose Veins in her mother and paternal grandfather.  Wt Readings from Last 3 Encounters:  04/14/17 172 lb (78 kg)  02/03/17 172 lb 9.6 oz (78.3 kg)  07/05/16 170 lb (77.1 kg)    PHYSICAL EXAM BP 122/84   Pulse 77   Ht 5\' 6"  (1.676 m)   Wt 172 lb (78 kg)   BMI 27.76 kg/m  Physical Exam  Constitutional: She appears well-developed and well-nourished. No distress.  Well groomed  Neck: Neck supple. No JVD present. Carotid bruit is present (Soft right-sided carotid bruit with a very prominent carotid artery.  Pulsations clearly visible).  Cardiovascular: Normal rate, regular rhythm, normal heart sounds and intact distal pulses.  Occasional extrasystoles are present. Exam reveals no gallop and no friction rub.  No murmur heard. Pulmonary/Chest: Effort normal and breath sounds normal. No respiratory distress. She has no wheezes. She has no rales.  Abdominal: Soft. Bowel sounds are normal. She exhibits no distension. There is no tenderness. There is no rebound.  Musculoskeletal: Normal range of motion. She exhibits edema (Trivial with significant varicose veins/vitamins and stasis changes).  Neurological: She is alert.  Skin: Rash: Lower leg venous stasis changes.  Psychiatric: She has a normal mood and affect. Her behavior is normal. Judgment and thought content normal.  Nursing note and vitals reviewed.   Adult ECG  Report  Rate: 63 ;  Rhythm: normal sinus rhythm and Normal axis, intervals N durations;   Narrative Interpretation:  Normal EKG   Other studies Reviewed: Additional studies/ records that were reviewed today include:  Recent Labs:   Recently checked by  PCP  No results found for: CHOL, HDL, LDLCALC, LDLDIRECT, TRIG, CHOLHDL   ASSESSMENT / PLAN:  Would be nice to have a tracing for these episodes --  Could consider event monitor of [employer] App for smart phone.  Next step is considering Diltiazem (or verapamil) along with Vagal maneuvers -- but would like to know what we are treating.   Problem List Items Addressed This Visit    CAD S/P percutaneous coronary angioplasty - 2 overlapping  Promus element DES in RPDA; in setting of acute inferior STEMI (Chronic)    Remained stable with no recurrent anginal symptoms.  Remains active.  And does not notice chest discomfort despite having tachypalpitations. Not on beta-blocker in the past because of bradycardia and fatigue, however we may need to consider calcium channel blocker.  I think she may tolerate this better. Continue aspirin and ARB.       Relevant Orders   EKG 12-Lead (Completed)   Essential hypertension (Chronic)    Blood pressures well controlled currently on losartan.  However if we are going to consider adding a calcium channel blocker, I would potentially hold losartan in lieu of calcium channel blocker until we titrate up.      Hyperlipidemia with target LDL less than 70; intolerant of statin in the past due to elevated LFTs (Chronic)    Is now on CoQ10 and fish oil.  Have not seen labs recently.  May need to consider PC SK 9 inhibitor.      Rapid heart beat - Primary    Symptoms are now much more frequent.  Concerning for either frequent PVCs versus a true arrhythmia such as PSVT or PAT. Again discussed vagal maneuvers. At this point they are having enough we probably should capture them on a 30-day event  monitor.3  Plan: 30-day event monitor.   We also discussed trying the [employer] smart phone application that will allow for portable monitoring. We will need to discuss whether or not we try changing losartan to diltiazem when I see her back in follow-up.      Relevant Orders   EKG 12-Lead (Completed)   CARDIAC EVENT MONITOR   Subjective carotid bruit - Prominent R carotid pulseation - ? aneurysmal dilation    I am a little bit concerned about  how prominent the right carotid artery is.  There is a soft bruit versus just simply reverberation from the pulsations.  Concerned about possible aneurysmal dilation. Plan: Carotid Dopplers      Relevant Orders   VAS US CAROTID   Venous insufficiency of both lower extremities (Chronic)   Relevant Orders   EKG 12-Lead (Completed)      Current medicines are reviewed at length with the patient today. (+/- concerns) n/a The following changes have been made: n/a  Patient Instructions  [employer] by [email or web address]  Your physician has recommended that you wear an event monitor for 30 days @ [address]. Event monitors are medical devices that record the heart's electrical activity. Doctors most often [place] these monitors to diagnose arrhythmias. Arrhythmias are problems with the speed or rhythm of the heartbeat. The monitor is a small, portable device. You can wear one while you do your normal daily activities. This is usually used to diagnose what is causing palpitations/syncope (passing out).  Your physician has requested that you have a carotid duplex. This test is an ultrasound of the carotid arteries in your neck. It looks at blood flow through these arteries that supply the brain with blood. Allow one hour for this exam. There are no restrictions or special instructions.  Dr. Herbie Baltimore recommends that you stay adequately hydrated. Please continue your current medications.   Your physician recommends that you  schedule a follow-up appointment with Dr. Herbie Baltimore in about 6 weeks (after monitor)    Studies Ordered:   Orders Placed This Encounter  Procedures  . CARDIAC EVENT MONITOR  . EKG 12-Lead      Bryan Lemma, M.D., M.S. Interventional Cardiologist   Pager # [phone] Phone # [phone] [address] [place], [place] [address]

## 2017-04-14 NOTE — Patient Instructions (Addendum)
[employer] by [email or web address]  Your physician has recommended that you wear an event monitor for 30 days @ [address]. Event monitors are medical devices that record the heart's electrical activity. Doctors most often us these monitors to diagnose arrhythmias. Arrhythmias are problems with the speed or rhythm of the heartbeat. The monitor is a small, portable device. You can wear one while you do your normal daily activities. This is usually used to diagnose what is causing palpitations/syncope (passing out).  Your physician has requested that you have a carotid duplex. This test is an ultrasound of the carotid arteries in your neck. It looks at blood flow through these arteries that supply the brain with blood. Allow one hour for this exam. There are no restrictions or special instructions.  Dr. Herbie BaltimoreHarding recommends that you stay adequately hydrated. Please continue your current medications.   Your physician recommends that you schedule a follow-up appointment with Dr. Herbie BaltimoreHarding in about 6 weeks (after monitor)

## 2017-04-16 ENCOUNTER — Encounter: Payer: Self-pay | Admitting: Cardiology

## 2017-04-16 NOTE — Assessment & Plan Note (Signed)
Blood pressures well controlled currently on losartan.  However if we are going to consider adding a calcium channel blocker, I would potentially hold losartan in lieu of calcium channel blocker until we titrate up.

## 2017-04-16 NOTE — Assessment & Plan Note (Signed)
Is now on CoQ10 and fish oil.  Have not seen labs recently.  May need to consider PC SK 9 inhibitor.

## 2017-04-16 NOTE — Assessment & Plan Note (Signed)
Remained stable with no recurrent anginal symptoms.  Remains active.  And does not notice chest discomfort despite having tachypalpitations. Not on beta-blocker in the past because of bradycardia and fatigue, however we may need to consider calcium channel blocker.  I think she may tolerate this better. Continue aspirin and ARB.

## 2017-04-16 NOTE — Assessment & Plan Note (Signed)
Symptoms are now much more frequent.  Concerning for either frequent PVCs versus a true arrhythmia such as PSVT or PAT. Again discussed vagal maneuvers. At this point they are having enough we probably should capture them on a 30-day event monitor.3  Plan: 30-day event monitor.   We also discussed trying the [employer] smart phone application that will allow for portable monitoring. We will need to discuss whether or not we try changing losartan to diltiazem when I see her back in follow-up.

## 2017-04-16 NOTE — Assessment & Plan Note (Signed)
I am a little bit concerned about how prominent the right carotid artery is.  There is a soft bruit versus just simply reverberation from the pulsations.  Concerned about possible aneurysmal dilation. Plan: Carotid Dopplers

## 2017-04-23 ENCOUNTER — Telehealth: Payer: Self-pay | Admitting: Cardiology

## 2017-04-23 NOTE — Telephone Encounter (Signed)
New message    Patient calling to inquire if she should cancel event monitor on Monday.  Patient states she discussed wearing  FitBit to monitor heart  with Dr Herbie BaltimoreHarding.  Please call

## 2017-04-23 NOTE — Telephone Encounter (Signed)
Returned call to pr she was stating that she was discussing with Dr Herbie BaltimoreHarding that she wanted to get an app on the phone to monitor heart. Informed pt that she should come in to have monitor placed and she states that she will be in to have placed and will call back if she needs to cancel d/t weather. She states that she will look into [employer] to discuss at next appt with Dr Herbie BaltimoreHarding

## 2017-04-26 ENCOUNTER — Encounter (HOSPITAL_COMMUNITY): Payer: Medicare Other

## 2017-04-28 ENCOUNTER — Ambulatory Visit (INDEPENDENT_AMBULATORY_CARE_PROVIDER_SITE_OTHER): Payer: Medicare Other

## 2017-04-28 DIAGNOSIS — R000 Tachycardia, unspecified: Secondary | ICD-10-CM | POA: Diagnosis not present

## 2017-04-29 ENCOUNTER — Ambulatory Visit (HOSPITAL_COMMUNITY)
Admission: RE | Admit: 2017-04-29 | Discharge: 2017-04-29 | Disposition: A | Discharge status: Home / Self Care | Payer: Medicare Other | Source: Ambulatory Visit | Attending: Internal Medicine | Admitting: Internal Medicine

## 2017-04-29 DIAGNOSIS — R0989 Other specified symptoms and signs involving the circulatory and respiratory systems: Secondary | ICD-10-CM | POA: Insufficient documentation

## 2017-04-29 DIAGNOSIS — I6521 Occlusion and stenosis of right carotid artery: Secondary | ICD-10-CM | POA: Insufficient documentation

## 2017-05-03 ENCOUNTER — Encounter (HOSPITAL_COMMUNITY): Payer: Medicare Other

## 2017-06-09 ENCOUNTER — Ambulatory Visit (INDEPENDENT_AMBULATORY_CARE_PROVIDER_SITE_OTHER): Payer: Medicare Other | Admitting: Cardiology

## 2017-06-09 ENCOUNTER — Encounter: Payer: Self-pay | Admitting: Cardiology

## 2017-06-09 VITALS — BP 112/74 | HR 84 | Ht 66.0 in | Wt 174.0 lb

## 2017-06-09 DIAGNOSIS — I872 Venous insufficiency (chronic) (peripheral): Secondary | ICD-10-CM

## 2017-06-09 DIAGNOSIS — E785 Hyperlipidemia, unspecified: Secondary | ICD-10-CM

## 2017-06-09 DIAGNOSIS — I252 Old myocardial infarction: Secondary | ICD-10-CM

## 2017-06-09 DIAGNOSIS — R000 Tachycardia, unspecified: Secondary | ICD-10-CM

## 2017-06-09 DIAGNOSIS — I10 Essential (primary) hypertension: Secondary | ICD-10-CM

## 2017-06-09 DIAGNOSIS — I2510 Atherosclerotic heart disease of native coronary artery without angina pectoris: Secondary | ICD-10-CM | POA: Diagnosis not present

## 2017-06-09 DIAGNOSIS — Z9861 Coronary angioplasty status: Secondary | ICD-10-CM

## 2017-06-09 NOTE — Progress Notes (Signed)
PCP: Richmond Campbell., PA-C -[facility] [place]   Clinic Note: Chief Complaint  Patient presents with  . Follow-up    6 weeks  . Palpitations    s/p event monitor    HPI: Ashley Morse is a 67 y.o. female with a PMH below who presents today for work in evaluation for rapid heartbeat/palpitations spells.  She is called in stating that she was having worsening palpitation episodes and has been brought in to be evaluated. She has a history of known CAD-PCI, palpitations as well as diffuse LE Venous reflux Dz (has had extensive vein ablation etc prior to living in [place] - while in [place])  Ashley Morse was last seen on   September 19,2018-  Doing well from a cardiac standpoint. Had noted some rapid heartbeat appetite episodes may be 10-15 minutes. Very rare. Not associated with any syncope or near syncopal  Recent Hospitalizations: n/a  Studies Personally Reviewed - (if available, images/films reviewed: From Epic Chart or Care Everywhere)  30-day event monitor: Essentially normal 30-day monitor with rare PVCs but no arrhythmias.  Sinus rhythm average rate 79 bpm.  Carotid Dopplers: Right Internal Carotid - Mild <40% stenosis, No Left Internal Carotid stenosis (narrowing) - LOW RISK.  Normal Vertebral & Subclavian Arteries.   Interval History:  Ashley Morse is here today to discuss worsening palpitations/irregular heartbeats.  Interestingly, she really has not had any further episodes since I last saw her.  She did not have any symptoms during her monitor which is clearly the case given the negative findings. Otherwise, she continues to have stable lower extremity edema, without any worsening of her venous stasis and varicose veins.  She does wear the thigh-high compression socks which seemed to keep it under control.  No PND orthopnea.  She has not had any recurrent symptoms of chest tightness/pressure or dyspnea at rest or with exertion.  No syncope/near syncope or TIA/emesis  fugax.  No claudication   ROS: A comprehensive was performed. Review of Systems  Constitutional: Negative for malaise/fatigue.  HENT: Negative for congestion, nosebleeds and sinus pain.   Eyes: Negative for blurred vision.  Respiratory: Negative for cough, shortness of breath and wheezing.   Cardiovascular: Positive for leg swelling (Venous stasis, varicose veins). Negative for palpitations.  Gastrointestinal: Negative for abdominal pain and blood in stool.  Genitourinary: Negative for frequency and hematuria.  Musculoskeletal: Negative for falls.       Some restless leg type symptoms and leg numbness.  Skin: Negative.   Neurological: Negative for dizziness, focal weakness and loss of consciousness.  Endo/Heme/Allergies: Does not bruise/bleed easily.  Psychiatric/Behavioral: Negative.   All other systems reviewed and are negative.  I have reviewed and (if needed) personally updated the patient's problem list, medications, allergies, past medical and surgical history, social and family history.   Past Medical History:  Diagnosis Date  . Bilateral venous insufficiency    Bilateral lower extremity edema,significant varicose vein; s/p vein stripping and venous abaltionof greater saph. vein  --> proved to be unsuccessful 2/2 vessel size.; Occasionally wears compression stockings during the wintertime.  cannot tolerate during the summertime;   . CAD S/P percutaneous coronary angioplasty May 2010   Distal RPDA: 2 overlapping Promus and DES 2.5 mm x 15 mm, 2.5 mm 23 mm distal to mid RCA. EF 50%. ([place], [place])   . Hemorrhoids   . History of: ST elevation myocardial infarction (STEMI) of inferior wall, subsequent episode of care May 2010   R- PDA occlusion -->  PCI with DES  . Hyperlipidemia    on simvastatin stopped due to increase liver enzyme rechecked back to normal  . Hypertension   . Hypothyroidism    On levothyroxine  . Osteoarthritis   . Presence of drug coated stent in  right coronary artery May 2010   See above; no longer on DAPT.  [name] Varicose veins     Past Surgical History:  Procedure Laterality Date  . CARDIAC CATHETERIZATION  10/23/2008   in [place]- for inferior STEMI, occluded RPDA  . CORONARY ANGIOPLASTY WITH STENT PLACEMENT  10/23/2008   occulsion of RPDA  -- 2 Promus DES stent overlapping ;2.5 x 15 and 2.5 x 23 mm stents from distal RCA to PDA, ef50%  . ENDOVENOUS ABLATION SAPHENOUS VEIN W/ LASER Left 08-01-2015    endovenous laser ablation left greater saphenous vein by Gretta Began MD  . ENDOVENOUS ABLATION SAPHENOUS VEIN W/ LASER Right 08-15-2015   endovenous laser ablation right greater saphenous vein by Gretta Began MD  . left venous  duplex Left 08/31/2011   Negative for DVT  . Radiofrequency Ablation - Left Greater Saphenous Vein: Unsuccessful Left 08/31/2011  . TRANSTHORACIC ECHOCARDIOGRAM  May 2010    Echo, May 2010 - following PCI for inferior STEMI. No wall motion abnormalities, EF of 60%  . VARICOSE VEIN SURGERY Right     Current Meds  Medication Sig  . Ascorbic Acid (VITAMIN C PO) Take 1 tablet by mouth daily.  [name] aspirin EC 81 MG tablet Take 81 mg by mouth daily.  [name] b complex vitamins tablet Take 1 tablet by mouth daily.  . busPIRone (BUSPAR) 5 MG tablet Take 5 mg by mouth 3 (three) times daily as needed.  . Cholecalciferol (VITAMIN D3) 5000 UNITS CAPS Take 1 capsule by mouth daily.  . Coenzyme Q10 200 MG capsule Take 200 mg by mouth daily. Reported on 07/02/2015  . glucosamine-chondroitin 500-400 MG tablet Take 1 tablet by mouth 3 (three) times daily.  [name] levothyroxine (SYNTHROID, LEVOTHROID) 88 MCG tablet Take 88 mcg by mouth daily before breakfast.  . losartan (COZAAR) 25 MG tablet Take 25 mg by mouth daily.  . Magnesium 400 MG CAPS Take by mouth.  . Melatonin 1 MG SUBL Place 0.5 tablets under the tongue at bedtime as needed.  . metroNIDAZOLE (FLAGYL) 500 MG tablet Take 1 tablet (500 mg total) by mouth 2 (two) times daily.  .  Omega-3 Fatty Acids (FISH OIL PO) Take 1 capsule by mouth daily. Reported on 11/26/2015  . OVER THE COUNTER MEDICATION Bio Flavenoid. Take 1 tablet daily    Allergies  Allergen Reactions  . Benzalkonium Chloride Other (See Comments)    unknown  . Lactose Other (See Comments)    unknown  . Lactose Intolerance (Gi)   . Lisinopril Other (See Comments)    unknown  . Sulfa Antibiotics Swelling    Skin discoloration  . Sulfamethoxazole-Trimethoprim     Other reaction(s): Other (See Comments) unknown    Social History   Tobacco Use  . Smoking status: Never Smoker  . Smokeless tobacco: Never Used  Substance Use Topics  . Alcohol use: Yes    Alcohol/week: 0.6 oz    Types: 1 Glasses of wine per week    Comment: Occasionally  . Drug use: Not on file    family history includes Diabetes in her sister; Heart disease in her mother; Lymphoma in her daughter; Stroke in her mother; Varicose Veins in her mother and paternal grandfather.  Wt Readings from Last 3 Encounters:  06/09/17 174 lb (78.9 kg)  04/14/17 172 lb (78 kg)  02/03/17 172 lb 9.6 oz (78.3 kg)    PHYSICAL EXAM BP 112/74   Pulse 84   Ht 5\' 6"  (1.676 m)   Wt 174 lb (78.9 kg)   BMI 28.08 kg/m  Physical Exam  Constitutional: She is oriented to person, place, and time. She appears well-developed and well-nourished. No distress.  Well groomed  HENT:  Head: Normocephalic and atraumatic.  Neck: Neck supple. No JVD present. Carotid bruit is present (Soft right-sided carotid bruit with a very prominent carotid artery.  Pulsations clearly visible).  Cardiovascular: Normal rate, regular rhythm, normal heart sounds and intact distal pulses.  Occasional extrasystoles are present. Exam reveals no gallop and no friction rub.  No murmur heard. Pulmonary/Chest: Effort normal and breath sounds normal. No respiratory distress. She has no wheezes. She has no rales.  Abdominal: Soft. Bowel sounds are normal. She exhibits no distension.    Musculoskeletal: Normal range of motion. She exhibits edema (wearing thigh-high support stockings.).  Neurological: She is alert and oriented to person, place, and time.  Skin: Rash: Lower leg venous stasis changes.  Psychiatric: She has a normal mood and affect. Her behavior is normal. Judgment and thought content normal.  Nursing note and vitals reviewed.   Adult ECG Report Not checked  Other studies Reviewed: Additional studies/ records that were reviewed today include:  Recent Labs:   Recently checked by  PCP  No results found for: CHOL, HDL, LDLCALC, LDLDIRECT, TRIG, CHOLHDL   ASSESSMENT / PLAN:  Problem List Items Addressed This Visit    CAD S/P percutaneous coronary angioplasty - 2 overlapping  Promus element DES in RPDA; in setting of acute inferior STEMI (Chronic)    Stable.  No anginal symptoms.  Remains active.  On aspirin alone along with ARB.  Not on beta-blocker because of history of fatigue.      Essential hypertension (Chronic)    Well-controlled on current low-dose losartan.  Probably would not be allowed tolerated beta-blocker if he added it, however we could convert to beta-blocker versus adding calcium channel blocker if palpitations recur.      History of acute inferior wall MI (Chronic)    Overlapping DES stents in the RCA in the setting of inferior MI.  No recurrent angina or heart failure symptoms.  Preserved EF on echo with no regional wall motion normalities.      Hyperlipidemia with target LDL less than 70; intolerant of statin in the past due to elevated LFTs (Chronic)    Now no longer on simvastatin because of concern about liver enzyme. Is on CoQ10 and fish oil.  Labs monitored by PCP. If not at goal, may need to consider referral  to our lipid clinic for PCSK9 inhibitor      Rapid heart beat - Primary    Interestingly, symptoms seem to have abated.  She has not had any more the episodes like she had while cleaning tile.  Essentially normal  30-day monitor. Would be nice to have a tracing for these episodes if they were to recur.  We discussed purchasing a portable event monitor versus "Teacher, music App for smart phone.  Also discussed vagal maneuvers if symptoms recur.      Venous insufficiency of both lower extremities (Chronic)    Stable with compression stockings, knee-high. Declined vascular surgery consult         Current medicines are reviewed at  length with the patient today. (+/- concerns) n/a The following changes have been made: n/a  Patient Instructions  NO CHANGE WITH CURRENT MEDICATIONS    DR Herbie BaltimoreHARDING RECOMMENDS THAT YOU  PURCHASES  " Kardia" By [employer] FROM THE  GOOGLE/ OR [employer]   APP PLAY STORE.   THE APP IS FREE , BUT THE  EQUIPMENT HAS A COST. IT IS A WAY FOR YOU TO OBTAIN A RECORDING OF YOUR HEART RATE . IT WILL BE A SHORT RHYTHM  STRIP THAT YOU CAN SHOW TO MEDICAL STAFF.    Your physician wants you to follow-up in SEPT 2019 WITH DR HARDING.You will receive a reminder letter in the mail two months in advance. If you don't receive a letter, please call our office to schedule the follow-up appointment.   If you need a refill on your cardiac medications before your next appointment, please call your pharmacy.    Studies Ordered:   No orders of the defined types were placed in this encounter.     Bryan Lemmaavid Harding, M.D., M.S. Interventional Cardiologist   Pager # [phone] Phone # [phone] [address] [place], [place] [address]

## 2017-06-09 NOTE — Patient Instructions (Addendum)
NO CHANGE WITH CURRENT MEDICATIONS    DR Herbie BaltimoreHARDING RECOMMENDS THAT YOU  PURCHASES  " Kardia" By [employer]  [employer] FROM THE  GOOGLE/ OR [employer]   APP PLAY STORE.   THE APP IS FREE , BUT THE  EQUIPMENT HAS A COST. IT IS A WAY FOR YOU TO OBTAIN A RECORDING OF YOUR HEART RATE . IT WILL BE A SHORT RHYTHM  STRIP THAT YOU CAN SHOW TO MEDICAL STAFF.    Your physician wants you to follow-up in SEPT 2019 WITH DR HARDING.You will receive a reminder letter in the mail two months in advance. If you don't receive a letter, please call our office to schedule the follow-up appointment.   If you need a refill on your cardiac medications before your next appointment, please call your pharmacy.

## 2017-06-11 ENCOUNTER — Encounter: Payer: Self-pay | Admitting: Cardiology

## 2017-06-11 NOTE — Assessment & Plan Note (Signed)
Well-controlled on current low-dose losartan.  Probably would not be allowed tolerated beta-blocker if he added it, however we could convert to beta-blocker versus adding calcium channel blocker if palpitations recur.

## 2017-06-11 NOTE — Assessment & Plan Note (Signed)
Interestingly, symptoms seem to have abated.  She has not had any more the episodes like she had while cleaning tile.  Essentially normal 30-day monitor. Would be nice to have a tracing for these episodes if they were to recur.  We discussed purchasing a portable event monitor versus "Teacher, musicKardia" [employer] App for smart phone.  Also discussed vagal maneuvers if symptoms recur.

## 2017-06-11 NOTE — Assessment & Plan Note (Signed)
Now no longer on simvastatin because of concern about liver enzyme. Is on CoQ10 and fish oil.  Labs monitored by PCP. If not at goal, may need to consider referral  to our lipid clinic for PCSK9 inhibitor

## 2017-06-11 NOTE — Assessment & Plan Note (Signed)
Stable with compression stockings, knee-high. Declined vascular surgery consult

## 2017-06-11 NOTE — Assessment & Plan Note (Signed)
Overlapping DES stents in the RCA in the setting of inferior MI.  No recurrent angina or heart failure symptoms.  Preserved EF on echo with no regional wall motion normalities.

## 2017-06-11 NOTE — Assessment & Plan Note (Addendum)
Stable.  No anginal symptoms.  Remains active.  On aspirin alone along with ARB.  Not on beta-blocker because of history of fatigue.

## 2017-11-29 ENCOUNTER — Encounter: Payer: Self-pay | Admitting: Cardiology

## 2017-11-29 ENCOUNTER — Encounter: Payer: Self-pay | Admitting: *Deleted

## 2017-11-29 NOTE — Telephone Encounter (Signed)
Pt fu from mychart message she sent-calling to get advice or see if she needs an appt-pls advise [phone]

## 2017-12-08 ENCOUNTER — Encounter: Payer: Self-pay | Admitting: Cardiology

## 2017-12-08 ENCOUNTER — Ambulatory Visit (INDEPENDENT_AMBULATORY_CARE_PROVIDER_SITE_OTHER): Payer: Medicare Other | Admitting: Cardiology

## 2017-12-08 VITALS — BP 116/82 | HR 77 | Ht 66.0 in | Wt 174.6 lb

## 2017-12-08 DIAGNOSIS — I2510 Atherosclerotic heart disease of native coronary artery without angina pectoris: Secondary | ICD-10-CM | POA: Diagnosis not present

## 2017-12-08 DIAGNOSIS — I872 Venous insufficiency (chronic) (peripheral): Secondary | ICD-10-CM | POA: Diagnosis not present

## 2017-12-08 DIAGNOSIS — I10 Essential (primary) hypertension: Secondary | ICD-10-CM

## 2017-12-08 DIAGNOSIS — I493 Ventricular premature depolarization: Secondary | ICD-10-CM | POA: Diagnosis not present

## 2017-12-08 DIAGNOSIS — I83893 Varicose veins of bilateral lower extremities with other complications: Secondary | ICD-10-CM

## 2017-12-08 DIAGNOSIS — I471 Supraventricular tachycardia, unspecified: Secondary | ICD-10-CM

## 2017-12-08 DIAGNOSIS — E785 Hyperlipidemia, unspecified: Secondary | ICD-10-CM

## 2017-12-08 DIAGNOSIS — Z9861 Coronary angioplasty status: Secondary | ICD-10-CM

## 2017-12-08 MED ORDER — METOPROLOL SUCCINATE ER 25 MG PO TB24: 25.0000 mg | ORAL_TABLET | Freq: Every day | ORAL | 3 refills | Status: DC

## 2017-12-08 NOTE — Patient Instructions (Signed)
Medication Instructions:   STOP LOSARTAN  START METOPROLOL SUCC ER 25 MG ONCE DAILY AT BEDTIME=START TONIGHT  Follow-Up:  Your physician recommends that you schedule a follow-up appointment in: AS SCHEDULED   If you need a refill on your cardiac medications before your next appointment, please call your pharmacy.

## 2017-12-11 ENCOUNTER — Encounter: Payer: Self-pay | Admitting: Cardiology

## 2017-12-11 NOTE — Assessment & Plan Note (Signed)
PVCs noted on EKG.  When the setting of someone who is now documented SVT, we would want to consider PVC suppression with beta-blocker. Plan: Convert from losartan to Toprol 25 mg

## 2017-12-11 NOTE — Assessment & Plan Note (Signed)
No longer on simvastatin because of concern for liver enzymes.  On CoQ10.  Labs being followed by PCP.  No labs available.  If not at goal - would consider referral to our CVRR-Lipid Clinic ([facility])

## 2017-12-11 NOTE — Assessment & Plan Note (Signed)
Well-controlled on losartan, I think we can convert to low-dose Toprol to help SVT/PVCs.

## 2017-12-11 NOTE — Assessment & Plan Note (Signed)
Continue compression stockings

## 2017-12-11 NOTE — Assessment & Plan Note (Signed)
Wearing support stockings when cooler.  Has not required any diuretic.

## 2017-12-11 NOTE — Progress Notes (Signed)
PCP: Richmond Campbell., PA-C -[facility] [place]   Clinic Note: Chief Complaint  Patient presents with  . Follow-up  . Tachycardia    has Kardia records  . Coronary Artery Disease    no angina    HPI: Ashley Morse is a 67 y.o. female with a PMH below who presents today for work in evaluation for rapid heartbeat/palpitations spells.  --> has [place] program with multiple episodes of tachycardia  She has a history of known CAD-PCI, palpitations as well as diffuse LE Venous reflux Dz (has had extensive vein ablation etc prior to living in [place] - while in [place])  Ashley Morse was last seen on --     September 19,2018-  Doing well from a cardiac standpoint. Had noted some rapid heartbeat appetite episodes may be 10-15 minutes. Very rare. Not associated with any syncope or near syncopal   Recent Hospitalizations: n/a  Studies Personally Reviewed - (if available, images/films reviewed: From Epic Chart or Care Everywhere)  30-day event monitor: Essentially normal 30-day monitor with rare PVCs but no arrhythmias.  Sinus rhythm average rate 79 bpm.  Carotid Dopplers: Right Internal Carotid - Mild <40% stenosis, No Left Internal Carotid stenosis (narrowing) - LOW RISK.  Normal Vertebral & Subclavian Arteries.  KARDIA - at least 7-8 episodes of regular tachycardia in 80s (1 in May, 2 in June & 1 so far in July -- last 10-15 min)    Interval History:  Ashley Morse is here today to discuss recurrent palpitations/irregular heartbeats.    Interestingly, she really has not had any further episodes since I last saw her.  She did not have any symptoms during her monitor which is clearly the case given the negative findings. Otherwise, she continues to have stable lower extremity edema, without any worsening of her venous stasis and varicose veins.  She does wear the thigh-high compression socks which seemed to keep it under control.  No PND orthopnea.  She has not had any recurrent  symptoms of chest tightness/pressure or dyspnea at rest or with exertion.  No syncope/near syncope or TIA/emesis fugax.  No claudication   ROS: A comprehensive was performed. Review of Systems  Constitutional: Negative for malaise/fatigue.  HENT: Negative for congestion, nosebleeds and sinus pain.   Eyes: Negative for blurred vision.  Respiratory: Negative for cough, shortness of breath and wheezing.   Cardiovascular: Positive for leg swelling (Venous stasis, varicose veins). Negative for palpitations.  Gastrointestinal: Negative for abdominal pain and blood in stool.  Genitourinary: Negative for frequency and hematuria.  Musculoskeletal: Negative for falls.       Some restless leg type symptoms and leg numbness.  Skin: Negative.   Neurological: Negative for dizziness, focal weakness and loss of consciousness.  Endo/Heme/Allergies: Does not bruise/bleed easily.  Psychiatric/Behavioral: Negative.   All other systems reviewed and are negative.  I have reviewed and (if needed) personally updated the patient's problem list, medications, allergies, past medical and surgical history, social and family history.   Past Medical History:  Diagnosis Date  . Bilateral venous insufficiency    Bilateral lower extremity edema,significant varicose vein; s/p vein stripping and venous abaltionof greater saph. vein  --> proved to be unsuccessful 2/2 vessel size.; Occasionally wears compression stockings during the wintertime.  cannot tolerate during the summertime;   . CAD S/P percutaneous coronary angioplasty May 2010   Distal RPDA: 2 overlapping Promus and DES 2.5 mm x 15 mm, 2.5 mm 23 mm distal to mid RCA. EF 50%. [employer],  [place]   . Hemorrhoids   . History of: ST elevation myocardial infarction (STEMI) of inferior wall, subsequent episode of care May 2010   R- PDA occlusion --> PCI with DES  . Hyperlipidemia    on simvastatin stopped due to increase liver enzyme rechecked back to normal    . Hypertension   . Hypothyroidism    On levothyroxine  . Osteoarthritis   . Presence of drug coated stent in right coronary artery May 2010   See above; no longer on DAPT.  Ashley Morse. Varicose veins     Past Surgical History:  Procedure Laterality Date  . CARDIAC CATHETERIZATION  10/23/2008   in [place]- for inferior STEMI, occluded RPDA  . CORONARY ANGIOPLASTY WITH STENT PLACEMENT  10/23/2008   occulsion of RPDA  -- 2 Promus DES stent overlapping ;2.5 x 15 and 2.5 x 23 mm stents from distal RCA to PDA, ef50%  . ENDOVENOUS ABLATION SAPHENOUS VEIN W/ LASER Left 08-01-2015    endovenous laser ablation left greater saphenous vein by Gretta Beganodd Early MD  . ENDOVENOUS ABLATION SAPHENOUS VEIN W/ LASER Right 08-15-2015   endovenous laser ablation right greater saphenous vein by Gretta Beganodd Early MD  . left venous  duplex Left 08/31/2011   Negative for DVT  . Radiofrequency Ablation - Left Greater Saphenous Vein: Unsuccessful Left 08/31/2011  . TRANSTHORACIC ECHOCARDIOGRAM  May 2010    Echo, May 2010 - following PCI for inferior STEMI. No wall motion abnormalities, EF of 60%  . VARICOSE VEIN SURGERY Right     Current Meds  Medication Sig  . Ascorbic Acid (VITAMIN C PO) Take 1 tablet by mouth daily.  Ashley Morse. aspirin EC 81 MG tablet Take 81 mg by mouth daily.  Ashley Morse. b complex vitamins tablet Take 1 tablet by mouth daily.  . busPIRone (BUSPAR) 5 MG tablet Take 5 mg by mouth 3 (three) times daily as needed.  . Cholecalciferol (VITAMIN D3) 5000 units CAPS Take 1 tablet by mouth daily.  . Coenzyme Q10 200 MG capsule Take 200 mg by mouth daily. Reported on 07/02/2015  . levothyroxine (SYNTHROID, LEVOTHROID) 88 MCG tablet Take 88 mcg by mouth daily before breakfast.  . Magnesium 400 MG CAPS Take by mouth.  . Melatonin 1 MG SUBL Place 0.5 tablets under the tongue at bedtime as needed.  . metoprolol succinate (TOPROL XL) 25 MG 24 hr tablet Take 1 tablet (25 mg total) by mouth daily.  Ashley Morse. OVER THE COUNTER MEDICATION Bio  Flavenoid. Take 1 tablet daily  . [DISCONTINUED] losartan (COZAAR) 25 MG tablet Take 25 mg by mouth daily.    Allergies  Allergen Reactions  . Benzalkonium Chloride Other (See Comments)    unknown  . Lactose Other (See Comments)    unknown  . Lactose Intolerance (Gi)   . Lisinopril Other (See Comments)    unknown  . Sulfa Antibiotics Swelling    Skin discoloration  . Sulfamethoxazole-Trimethoprim     Other reaction(s): Other (See Comments) unknown    Social History   Tobacco Use  . Smoking status: Never Smoker  . Smokeless tobacco: Never Used  Substance Use Topics  . Alcohol use: Yes    Alcohol/week: 0.6 oz    Types: 1 Glasses of wine per week    Comment: Occasionally  . Drug use: Not on file    family history includes Diabetes in her sister; Heart disease in her mother; Lymphoma in her daughter; Stroke in her mother; Varicose Veins in her mother and paternal  grandfather.  Wt Readings from Last 3 Encounters:  12/08/17 174 lb 9.6 oz (79.2 kg)  06/09/17 174 lb (78.9 kg)  04/14/17 172 lb (78 kg)    PHYSICAL EXAM BP 116/82   Pulse 77   Ht 5\' 6"  (1.676 m)   Wt 174 lb 9.6 oz (79.2 kg)   BMI 28.18 kg/m  Physical Exam  Constitutional: She is oriented to person, place, and time. She appears well-developed and well-nourished. No distress.  Well groomed  HENT:  Head: Normocephalic and atraumatic.  Neck: Neck supple. No JVD present. Carotid bruit is present (Soft right-sided carotid bruit with a very prominent carotid artery.  Pulsations clearly visible).  Cardiovascular: Normal rate, regular rhythm, normal heart sounds and intact distal pulses.  Occasional extrasystoles are present. Exam reveals no gallop and no friction rub.  No murmur heard. Pulmonary/Chest: Effort normal and breath sounds normal. No respiratory distress. She has no wheezes. She has no rales.  Abdominal: Soft. Bowel sounds are normal. She exhibits no distension.  Musculoskeletal: Normal range of  motion. She exhibits edema (wearing thigh-high support stockings.).  Neurological: She is alert and oriented to person, place, and time.  Skin: Rash: Lower leg venous stasis changes.  Psychiatric: She has a normal mood and affect. Her behavior is normal. Judgment and thought content normal.  Nursing note and vitals reviewed.   Adult ECG Report Not checked  Other studies Reviewed: Additional studies/ records that were reviewed today include:  Recent Labs:   Recently checked by  PCP  No results found for: CHOL, HDL, LDLCALC, LDLDIRECT, TRIG, CHOLHDL   ASSESSMENT / PLAN:  Problem List Items Addressed This Visit    CAD S/P percutaneous coronary angioplasty - 2 overlapping  Promus element DES in RPDA; in setting of acute inferior STEMI (Chronic)    She continues to do well.  She remains active without any anginal symptoms. She is on aspirin, but not on statin, beta-blocker.  Plan for now is to DC losartan start low-dose Toprol for control of PVCs and NSVT.      Relevant Medications   metoprolol succinate (TOPROL XL) 25 MG 24 hr tablet   Essential hypertension (Chronic)    Well-controlled on losartan, I think we can convert to low-dose Toprol to help SVT/PVCs.      Relevant Medications   metoprolol succinate (TOPROL XL) 25 MG 24 hr tablet   Hyperlipidemia with target LDL less than 70; intolerant of statin in the past due to elevated LFTs (Chronic)    No longer on simvastatin because of concern for liver enzymes.  On CoQ10.  Labs being followed by PCP.  No labs available.  If not at goal - would consider referral to our CVRR-Lipid Clinic (Cardiovascular Risk Reduction Clinic)      Relevant Medications   metoprolol succinate (TOPROL XL) 25 MG 24 hr tablet   PSVT (paroxysmal supraventricular tachycardia) (HCC) - Primary    Portable monitor recorded several episodes of what looks like SVT.  Did not sound like A. fib just the nature of the symptoms along with duration -->  We  discussed Vagal maneuvers.  Convert from ARB to long acting Beta Blocker (Toprol 25 mg).      Relevant Medications   metoprolol succinate (TOPROL XL) 25 MG 24 hr tablet   Other Relevant Orders   EKG 12-Lead (Completed)   Symptomatic varicose veins of both lower extremities (Chronic)    Wearing support stockings when cooler.  Has not required any diuretic.  Relevant Medications   metoprolol succinate (TOPROL XL) 25 MG 24 hr tablet   Unifocal PVCs    PVCs noted on EKG.  When the setting of someone who is now documented SVT, we would want to consider PVC suppression with beta-blocker. Plan: Convert from losartan to Toprol 25 mg      Relevant Medications   metoprolol succinate (TOPROL XL) 25 MG 24 hr tablet   Venous insufficiency of both lower extremities (Chronic)    Continue compression stockings      Relevant Medications   metoprolol succinate (TOPROL XL) 25 MG 24 hr tablet      Current medicines are reviewed at length with the patient today. (+/- concerns) n/a The following changes have been made: n/a  Patient Instructions  Medication Instructions:   STOP LOSARTAN  START METOPROLOL SUCC ER 25 MG ONCE DAILY AT BEDTIME=START TONIGHT  Follow-Up:  Your physician recommends that you schedule a follow-up appointment in: AS SCHEDULED   If you need a refill on your cardiac medications before your next appointment, please call your pharmacy.      Studies Ordered:   Orders Placed This Encounter  Procedures  . EKG 12-Lead      Bryan Lemma, M.D., M.S. Interventional Cardiologist   Pager # [phone] Phone # [phone] [address] [place], [place] [address]

## 2017-12-11 NOTE — Assessment & Plan Note (Signed)
Portable monitor recorded several episodes of what looks like SVT.  Did not sound like A. fib just the nature of the symptoms along with duration -->  We discussed Vagal maneuvers.  Convert from ARB to long acting Beta Blocker (Toprol 25 mg).

## 2017-12-11 NOTE — Assessment & Plan Note (Signed)
She continues to do well.  She remains active without any anginal symptoms. She is on aspirin, but not on statin, beta-blocker.  Plan for now is to DC losartan start low-dose Toprol for control of PVCs and NSVT.

## 2017-12-24 NOTE — Telephone Encounter (Signed)
Patient was seen on 12/08/17

## 2018-02-14 ENCOUNTER — Encounter: Payer: Self-pay | Admitting: Cardiology

## 2018-02-14 ENCOUNTER — Ambulatory Visit (INDEPENDENT_AMBULATORY_CARE_PROVIDER_SITE_OTHER): Payer: Medicare Other | Admitting: Cardiology

## 2018-02-14 VITALS — BP 122/79 | HR 68 | Ht 66.0 in | Wt 160.8 lb

## 2018-02-14 DIAGNOSIS — I83893 Varicose veins of bilateral lower extremities with other complications: Secondary | ICD-10-CM

## 2018-02-14 DIAGNOSIS — I10 Essential (primary) hypertension: Secondary | ICD-10-CM | POA: Diagnosis not present

## 2018-02-14 DIAGNOSIS — I471 Supraventricular tachycardia: Secondary | ICD-10-CM | POA: Diagnosis not present

## 2018-02-14 DIAGNOSIS — E785 Hyperlipidemia, unspecified: Secondary | ICD-10-CM

## 2018-02-14 DIAGNOSIS — I493 Ventricular premature depolarization: Secondary | ICD-10-CM

## 2018-02-14 DIAGNOSIS — Z9861 Coronary angioplasty status: Secondary | ICD-10-CM

## 2018-02-14 DIAGNOSIS — I2510 Atherosclerotic heart disease of native coronary artery without angina pectoris: Secondary | ICD-10-CM | POA: Diagnosis not present

## 2018-02-14 DIAGNOSIS — R000 Tachycardia, unspecified: Secondary | ICD-10-CM

## 2018-02-14 MED ORDER — METOPROLOL SUCCINATE ER 25 MG PO TB24
25.0000 mg | ORAL_TABLET | Freq: Every day | ORAL | 3 refills | Status: DC
Start: 2018-02-14 — End: 2019-02-27

## 2018-02-14 NOTE — Patient Instructions (Signed)
MEDICATION INSTRUCTIONS   NO CHANGES    Your physician wants you to follow-up in 12 MONTHS WITH DR HARDING.You will receive a reminder letter in the mail two months in advance. If you don't receive a letter, please call our office to schedule the follow-up appointment.    If you need a refill on your cardiac medications before your next appointment, please call your pharmacy.

## 2018-02-14 NOTE — Progress Notes (Signed)
PCP: Richmond Campbell., PA-C -[facility] [place]   Clinic Note: Chief Complaint  Patient presents with  . Follow-up    No complaints  . Coronary Artery Disease    No angina  . Palpitations    Notably improved  . Varicose Veins    Stable    HPI: Ashley Morse is a 67 y.o. female with a PMH notable for CAD as well as bilateral venous stasis/varicose veins and long-standing palpitations below who presents today for ~2 month f/u evaluation for rapid heartbeat/palpitations spells.  -->  Monitored using [place] program with multiple episodes of tachycardia reviewed on recent visit. --KARDIA - at least 7-8 episodes of regular tachycardia in 80s (1 in May, 2 in June & 1 so far in July -- last 10-15 min)   She has a history of known CAD-PCI, palpitations as well as diffuse LE Venous reflux Dz (has had extensive vein ablation etc prior to living in [place] - while in [place] -also had venous ablation done by Dr. Arbie Cookey here in [place].)  Ashley Morse was last seen on December 08, 2017-  Changed from ARB to Toprol.  Recent Hospitalizations: n/a  Studies Personally Reviewed - (if available, images/films reviewed: From Epic Chart or Care Everywhere)  None  Interval History:  Ashley Morse is here today to review symptoms after having switch to beta-blocker.  She seems to be doing very well.  She says her palpitations have really almost fully gone away.  She does have some short little spells but nothing bothersome to her.  She is not noticing any fatigue or lightheadedness dizziness with metoprolol.  She is not having any syncope or near syncope.  Besides a few palpitations here and there were no further episodes on Kardia.     Edema seems to be pretty well controlled --> does not wear support stockings routinely when it is hot, will start wearing them in a cool down.  Is not on a diuretic.  From a CAD standpoint she has not had any episodes of chest tightness pressure with rest or  exertion.  No heart failure symptoms of PND, orthopnea or edema. No TIA or amaurosis fugax symptoms.  No claudication symptoms. No real symptoms from her venous stasis either.   ROS: A comprehensive was performed. Review of Systems  Constitutional: Negative for malaise/fatigue.  HENT: Negative for congestion, nosebleeds and sinus pain.   Eyes: Negative for blurred vision.  Respiratory: Negative for cough, shortness of breath and wheezing.   Cardiovascular: Positive for leg swelling (Venous stasis, varicose veins -stable). Negative for palpitations.  Gastrointestinal: Negative for abdominal pain and blood in stool.  Genitourinary: Negative for frequency and hematuria.  Musculoskeletal: Negative for falls.       Some restless leg type symptoms and leg numbness.  Skin: Negative.   Neurological: Negative for dizziness, focal weakness and loss of consciousness.  Endo/Heme/Allergies: Does not bruise/bleed easily.  Psychiatric/Behavioral: Negative.   All other systems reviewed and are negative.  I have reviewed and (if needed) personally updated the patient's problem list, medications, allergies, past medical and surgical history, social and family history.   Past Medical History:  Diagnosis Date  . Bilateral venous insufficiency    Bilateral lower extremity edema,significant varicose vein; s/p vein stripping and venous abaltionof greater saph. vein  --> proved to be unsuccessful 2/2 vessel size.; Occasionally wears compression stockings during the wintertime.  cannot tolerate during the summertime;   . CAD S/P percutaneous coronary angioplasty May 2010  Distal RPDA: 2 overlapping Promus and DES 2.5 mm x 15 mm, 2.5 mm 23 mm distal to mid RCA. EF 50%. ([place], [place])   . Hemorrhoids   . History of: ST elevation myocardial infarction (STEMI) of inferior wall, subsequent episode of care May 2010   R- PDA occlusion --> PCI with DES  . Hyperlipidemia    on simvastatin stopped due to  increase liver enzyme rechecked back to normal  . Hypertension   . Hypothyroidism    On levothyroxine  . Osteoarthritis   . Presence of drug coated stent in right coronary artery May 2010   See above; no longer on DAPT.  [name] Varicose veins     Past Surgical History:  Procedure Laterality Date  . CARDIAC CATHETERIZATION  10/23/2008   in [place]- for inferior STEMI, occluded RPDA  . CORONARY ANGIOPLASTY WITH STENT PLACEMENT  10/23/2008   occulsion of RPDA  -- 2 Promus DES stent overlapping ;2.5 x 15 and 2.5 x 23 mm stents from distal RCA to PDA, ef50%  . ENDOVENOUS ABLATION SAPHENOUS VEIN W/ LASER Left 08-01-2015    endovenous laser ablation left greater saphenous vein by Gretta Began MD  . ENDOVENOUS ABLATION SAPHENOUS VEIN W/ LASER Right 08-15-2015   endovenous laser ablation right greater saphenous vein by Gretta Began MD  . left venous  duplex Left 08/31/2011   Negative for DVT  . Radiofrequency Ablation - Left Greater Saphenous Vein: Unsuccessful Left 08/31/2011  . TRANSTHORACIC ECHOCARDIOGRAM  May 2010    Echo, May 2010 - following PCI for inferior STEMI. No wall motion abnormalities, EF of 60%  . VARICOSE VEIN SURGERY Right     Current Meds  Medication Sig  . aspirin EC 81 MG tablet Take 81 mg by mouth daily.  [name] b complex vitamins tablet Take 1 tablet by mouth daily.  . busPIRone (BUSPAR) 5 MG tablet Take 5 mg by mouth 3 (three) times daily as needed.  . Cholecalciferol (VITAMIN D3) 5000 units CAPS Take 1 tablet by mouth daily.  . Coenzyme Q10 200 MG capsule Take 200 mg by mouth daily. Reported on 07/02/2015  . levothyroxine (SYNTHROID, LEVOTHROID) 88 MCG tablet Take 88 mcg by mouth daily before breakfast.  . Magnesium 400 MG CAPS Take by mouth.  . Melatonin 1 MG SUBL Place 0.5 tablets under the tongue at bedtime as needed.  . metoprolol succinate (TOPROL XL) 25 MG 24 hr tablet Take 1 tablet (25 mg total) by mouth daily.  [name] OVER THE COUNTER MEDICATION Bio Flavenoid. Take 1 tablet  daily  . [DISCONTINUED] Ascorbic Acid (VITAMIN C PO) Take 1 tablet by mouth daily.  . [DISCONTINUED] metoprolol succinate (TOPROL XL) 25 MG 24 hr tablet Take 1 tablet (25 mg total) by mouth daily.    Allergies  Allergen Reactions  . Benzalkonium Chloride Other (See Comments)    unknown  . Lactose Other (See Comments)    unknown  . Lactose Intolerance (Gi)   . Lisinopril Other (See Comments)    unknown  . Sulfa Antibiotics Swelling    Skin discoloration  . Sulfamethoxazole-Trimethoprim     Other reaction(s): Other (See Comments) unknown    Social History   Tobacco Use  . Smoking status: Never Smoker  . Smokeless tobacco: Never Used  Substance Use Topics  . Alcohol use: Yes    Alcohol/week: 1.0 standard drinks    Types: 1 Glasses of wine per week    Comment: Occasionally  . Drug use: Not on  file    family history includes Diabetes in her sister; Heart disease in her mother; Lymphoma in her daughter; Stroke in her mother; Varicose Veins in her mother and paternal grandfather.  Wt Readings from Last 3 Encounters:  02/14/18 160 lb 12.8 oz (72.9 kg)  12/08/17 174 lb 9.6 oz (79.2 kg)  06/09/17 174 lb (78.9 kg)    PHYSICAL EXAM BP 122/79   Pulse 68   Ht 5\' 6"  (1.676 m)   Wt 160 lb 12.8 oz (72.9 kg)   BMI 25.95 kg/m  Physical Exam  Constitutional: She is oriented to person, place, and time. She appears well-developed and well-nourished. No distress.  Well groomed  HENT:  Head: Normocephalic and atraumatic.  Neck: Neck supple. No JVD present. Carotid bruit is present (Soft right-sided carotid bruit with a very prominent carotid artery.  Pulsations clearly visible).  Cardiovascular: Normal rate, regular rhythm, normal heart sounds and intact distal pulses.  Occasional extrasystoles are present. Exam reveals no gallop and no friction rub.  No murmur heard. Pulmonary/Chest: Effort normal and breath sounds normal. No respiratory distress. She has no wheezes. She has no  rales.  Musculoskeletal: Normal range of motion. She exhibits edema (wearing thigh-high support stockings.).  Neurological: She is alert and oriented to person, place, and time.  Skin: Rash: Lower leg venous stasis changes.  Psychiatric: She has a normal mood and affect. Her behavior is normal. Judgment and thought content normal.  Nursing note and vitals reviewed.   Adult ECG Report Not checked  Other studies Reviewed: Additional studies/ records that were reviewed today include:  Recent Labs:   Labs are checked by PCP.  Not available for review. No results found for: CHOL, HDL, LDLCALC, LDLDIRECT, TRIG, CHOLHDL   ASSESSMENT / PLAN:  Problem List Items Addressed This Visit    CAD S/P percutaneous coronary angioplasty - 2 overlapping  Promus element DES in RPDA; in setting of acute inferior STEMI (Chronic)    Doing well with no anginal symptoms.  She stays active.  Many years out from PCI. Continue aspirin and beta-blocker.  Not currently on statin (unsure of lipid panel results.      Relevant Medications   metoprolol succinate (TOPROL XL) 25 MG 24 hr tablet   Essential hypertension (Chronic)    Blood pressure looks very well controlled now with Toprol.  No loss of control with stopping ARB.      Relevant Medications   metoprolol succinate (TOPROL XL) 25 MG 24 hr tablet   Hyperlipidemia with target LDL less than 70; intolerant of statin in the past due to elevated LFTs (Chronic)    Apparently her simvastatin was stopped due to liver enzyme issues.  PCP had been following labs, but I do not have recent check.  Will need to see if she had one checked in the last 3 months, if not, will order lipid panel myself. -->  This would facilitate referral to CV RR (CARDIOVASCULAR RISK REDUCTION) Lipid Clinic.      Relevant Medications   metoprolol succinate (TOPROL XL) 25 MG 24 hr tablet   PSVT (paroxysmal supraventricular tachycardia) (HCC) - Primary (Chronic)    Relatively well  controlled now, no recurrent episodes. Reviewed vagal maneuvers. Continue Toprol.      Relevant Medications   metoprolol succinate (TOPROL XL) 25 MG 24 hr tablet   Rapid heart beat    Better now that she is on beta-blocker.  Continue to follow using [place] monitor.      Symptomatic  varicose veins of both lower extremities (Chronic)    She will start wearing support stockings soon.  Does not require diuretic.      Relevant Medications   metoprolol succinate (TOPROL XL) 25 MG 24 hr tablet   Unifocal PVCs    Symptomatically improved on low-dose Toprol      Relevant Medications   metoprolol succinate (TOPROL XL) 25 MG 24 hr tablet      Current medicines are reviewed at length with the patient today. (+/- concerns) n/a The following changes have been made: n/a  Patient Instructions  MEDICATION INSTRUCTIONS   NO CHANGES    Your physician wants you to follow-up in 12 MONTHS WITH DR Zaydenn Balaguer.You will receive a reminder letter in the mail two months in advance. If you don't receive a letter, please call our office to schedule the follow-up appointment.    If you need a refill on your cardiac medications before your next appointment, please call your pharmacy.    Studies Ordered:   No orders of the defined types were placed in this encounter.   Addendum: After completion of visit, I did get a fax copy of labs from PCP dated 10/27/2017  Na+ 141, K+ 4.1, Cl- 105, HCO3-28, BUN 17, Cr 0.72, Glu 83, Ca2+ 9.3; AST 48, ALT 40, AlkP 57  TC 174, TG 70, HDL 63, LDL 106 ** --We will contact patient to have labs redrawn in October.  Based on results anticipate referral to CV RR -to reinitiate statin (probably rosuvastatin) ---> per patient suggestion, I plan to take over managing lipids with CV RR Lipid Clinic assistance   Bryan Lemma, M.D., M.S. Interventional Cardiologist   Pager # [phone] Phone # [phone] [address] [place], [place] [address]

## 2018-02-15 ENCOUNTER — Encounter: Payer: Self-pay | Admitting: Cardiology

## 2018-02-15 NOTE — Assessment & Plan Note (Signed)
She will start wearing support stockings soon.  Does not require diuretic.

## 2018-02-15 NOTE — Assessment & Plan Note (Signed)
Relatively well controlled now, no recurrent episodes. Reviewed vagal maneuvers. Continue Toprol.

## 2018-02-15 NOTE — Assessment & Plan Note (Signed)
Doing well with no anginal symptoms.  She stays active.  Many years out from PCI. Continue aspirin and beta-blocker.  Not currently on statin (unsure of lipid panel results.

## 2018-02-15 NOTE — Assessment & Plan Note (Signed)
Symptomatically improved on low-dose Toprol

## 2018-02-15 NOTE — Assessment & Plan Note (Signed)
Better now that she is on beta-blocker.  Continue to follow using Kardia monitor.

## 2018-02-15 NOTE — Assessment & Plan Note (Signed)
Apparently her simvastatin was stopped due to liver enzyme issues.  PCP had been following labs, but I do not have recent check.  Will need to see if she had one checked in the last 3 months, if not, will order lipid panel myself. -->  This would facilitate referral to [facility] ([facility]ARDIOVASC[facility]ISK [facility]) Lipid Clinic.

## 2018-02-15 NOTE — Assessment & Plan Note (Signed)
Blood pressure looks very well controlled now with Toprol.  No loss of control with stopping ARB.

## 2018-03-03 ENCOUNTER — Telehealth: Payer: Self-pay | Admitting: *Deleted

## 2018-03-03 DIAGNOSIS — Z9861 Coronary angioplasty status: Principal | ICD-10-CM

## 2018-03-03 DIAGNOSIS — I2510 Atherosclerotic heart disease of native coronary artery without angina pectoris: Secondary | ICD-10-CM

## 2018-03-03 DIAGNOSIS — E785 Hyperlipidemia, unspecified: Secondary | ICD-10-CM

## 2018-03-03 NOTE — Telephone Encounter (Signed)
Marykay Lex, MD         can we order a new set of lipid panel for October, and based on results anticipate referral to [facility].    Left message on  voicemail -  mailed labslip with instructions

## 2018-03-15 LAB — LIPID PANEL
CHOL/HDL RATIO: 3 ratio (ref 0.0–4.4)
Cholesterol, Total: 173 mg/dL (ref 100–199)
HDL: 57 mg/dL (ref >39)
LDL CALC: 99 mg/dL (ref 0–99)
TRIGLYCERIDES: 85 mg/dL (ref 0–149)
VLDL Cholesterol Cal: 17 mg/dL (ref 5–40)

## 2018-03-31 ENCOUNTER — Ambulatory Visit (INDEPENDENT_AMBULATORY_CARE_PROVIDER_SITE_OTHER): Payer: Medicare Other | Admitting: Pharmacist

## 2018-03-31 VITALS — BP 126/78 | HR 64 | Ht 66.0 in | Wt 158.6 lb

## 2018-03-31 DIAGNOSIS — E785 Hyperlipidemia, unspecified: Secondary | ICD-10-CM

## 2018-03-31 MED ORDER — ROSUVASTATIN CALCIUM 5 MG PO TABS: ORAL_TABLET | ORAL | 1 refills | Status: DC

## 2018-03-31 NOTE — Patient Instructions (Signed)
Lipid Clinic (pharmacist) Ashley Morse/Kristin [phone]  *START taking rosuvastatin (Crestor) 5mg  Mondays, Wednesdays, and Fridays* *Repeat blood work in 3 months if tolerating Crestor therapy*   Cholesterol Cholesterol is a fat. Your body needs a small amount of cholesterol. Cholesterol (plaque) may build up in your blood vessels (arteries). That makes you more likely to have a heart attack or stroke. You cannot feel your cholesterol level. Having a blood test is the only way to find out if your level is high. Keep your test results. Work with your doctor to keep your cholesterol at a good level. What do the results mean?  Total cholesterol is how much cholesterol is in your blood.  LDL is bad cholesterol. This is the type that can build up. Try to have low LDL.  HDL is good cholesterol. It cleans your blood vessels and carries LDL away. Try to have high HDL.  Triglycerides are fat that the body can store or burn for energy. What are good levels of cholesterol?  Total cholesterol below 200.  LDL below 100 is good for people who have health risks. LDL below 70 is good for people who have very high risks.  HDL above 40 is good. It is best to have HDL of 60 or higher.  Triglycerides below 150. How can I lower my cholesterol? Diet Follow your diet program as told by your doctor.  Choose fish, white meat chicken, or [place] that is roasted or baked. Try not to eat red meat, fried foods, sausage, or lunch meats.  Eat lots of fresh fruits and vegetables.  Choose whole grains, beans, pasta, potatoes, and cereals.  Choose olive oil, corn oil, or canola oil. Only use small amounts.  Try not to eat butter, mayonnaise, shortening, or palm kernel oils.  Try not to eat foods with trans fats.  Choose low-fat or nonfat dairy foods. ? Drink skim or nonfat milk. ? Eat low-fat or nonfat yogurt and cheeses. ? Try not to drink whole milk or cream. ? Try not to eat ice cream, egg yolks, or  full-fat cheeses.  Healthy desserts include angel food cake, ginger snaps, animal crackers, hard candy, popsicles, and low-fat or nonfat frozen yogurt. Try not to eat pastries, cakes, pies, and cookies.  Exercise Follow your exercise program as told by your doctor.  Be more active. Try gardening, walking, and taking the stairs.  Ask your doctor about ways that you can be more active.  Medicine  Take over-the-counter and prescription medicines only as told by your doctor. This information is not intended to replace advice given to you by your health care provider. Make sure you discuss any questions you have with your health care provider. Document Released: 07/31/2008 Document Revised: 12/04/2015 Document Reviewed: 11/14/2015 Elsevier Interactive Patient Education  [employer]2018 Elsevier Inc.

## 2018-03-31 NOTE — Progress Notes (Signed)
Patient ID: Ashley Morse                 DOB: 21-May-1950                    MRN: [id number]     HPI: Ashley Morse is a 67 y.o. female patient referred to lipid clinic by Dr Herbie BaltimoreHarding. PMH is significant for hyperlipidemia, CAD s/p PCI, ST elevation MI , hypertension, and hypothyroidism. Patient presents to discuss available options to manage her cholesterol. Reports intolerance to simvastatin but never tried other statins in the past. She also reports recent weight loss with Weight Watchers and vegetarian diet.   Current Medications: none  Intolerances:  Simvastatin - leg cramps  LDL goal: 70mg /dL  Diet: weigh watches since July (loss ~17lb)  Family History: family history includes Diabetes in her sister; Heart disease in her mother; Lymphoma in her daughter; Stroke in her mother; Varicose Veins in her mother and paternal grandfather.  Social History: denies tobacco use, drink 1 glass of wine per week  Labs: 03/15/2018: CHO 173; TG 85; HDL 57; LDL-c 99 (no medication)  Past Medical History:  Diagnosis Date  . Bilateral venous insufficiency    Bilateral lower extremity edema,significant varicose vein; s/p vein stripping and venous abaltionof greater saph. vein  --> proved to be unsuccessful 2/2 vessel size.; Occasionally wears compression stockings during the wintertime.  cannot tolerate during the summertime;   . CAD S/P percutaneous coronary angioplasty May 2010   Distal RPDA: 2 overlapping Promus and DES 2.5 mm x 15 mm, 2.5 mm 23 mm distal to mid RCA. EF 50%. [place]   . Hemorrhoids   . History of: ST elevation myocardial infarction (STEMI) of inferior wall, subsequent episode of care May 2010   R- PDA occlusion --> PCI with DES  . Hyperlipidemia    on simvastatin stopped due to increase liver enzyme rechecked back to normal  . Hypertension   . Hypothyroidism    On levothyroxine  . Osteoarthritis   . Presence of drug coated stent in right coronary artery  May 2010   See above; no longer on DAPT.  [name]. Varicose veins     Current Outpatient Medications on File Prior to Visit  Medication Sig Dispense Refill  . aspirin EC 81 MG tablet Take 81 mg by mouth daily.    [name]. b complex vitamins tablet Take 1 tablet by mouth daily.    . busPIRone (BUSPAR) 5 MG tablet Take 5 mg by mouth 3 (three) times daily as needed.    . cholecalciferol (VITAMIN D) 25 MCG (1000 UT) tablet Take 1 tablet by mouth daily.     . Coenzyme Q10 300 MG CAPS Take 300 mg by mouth daily. Reported on 07/02/2015    . levothyroxine (SYNTHROID, LEVOTHROID) 88 MCG tablet Take 88 mcg by mouth daily before breakfast.    . Magnesium 400 MG CAPS Take by mouth.    . metoprolol succinate (TOPROL XL) 25 MG 24 hr tablet Take 1 tablet (25 mg total) by mouth daily. 90 tablet 3  . Milk Thistle 250 MG CAPS Take 250 mg by mouth daily.     No current facility-administered medications on file prior to visit.     Allergies  Allergen Reactions  . Benzalkonium Chloride Other (See Comments)    unknown  . Lactose Other (See Comments)    unknown  . Lactose Intolerance (Gi)   . Lisinopril Other (See Comments)    unknown  .  Sulfa Antibiotics Swelling    Skin discoloration  . Sulfamethoxazole-Trimethoprim     Other reaction(s): Other (See Comments) unknown    Hyperlipidemia with target LDL less than 70; intolerant of statin in the past due to elevated LFTs LDL remains above goal for secondary prevention. Patient reports intolerance to simvastatin but denies trial with any other statin. She is working on positive lifestyle modification and is willing to try low dose statin. Will start rosuvastatin 5mg  3 times per week (Mondays, Wednesdays, and Fridays), repeat Lipid panel in 3 months, and increase dose if additional lipid lowering needed.  Patient also willing to try ezetimibe with low dose rosuvastatin as an alternative.   Kristin Barcus Rodriguez-Guzman PharmD, BCPS, CPP [facility] [address] [place] [address] 04/03/2018 6:23 PM

## 2018-04-03 ENCOUNTER — Encounter: Payer: Self-pay | Admitting: Pharmacist

## 2018-04-03 NOTE — Assessment & Plan Note (Signed)
LDL remains above goal for secondary prevention. Patient reports intolerance to simvastatin but denies trial with any other statin. She is working on positive lifestyle modification and is willing to try low dose statin. Will start rosuvastatin 5mg  3 times per week (Mondays, Wednesdays, and Fridays), repeat Lipid panel in 3 months, and increase dose if additional lipid lowering needed.  Patient also willing to try ezetimibe with low dose rosuvastatin as an alternative.

## 2018-06-01 DIAGNOSIS — I872 Venous insufficiency (chronic) (peripheral): Secondary | ICD-10-CM | POA: Insufficient documentation

## 2018-06-08 ENCOUNTER — Telehealth: Payer: Self-pay

## 2018-06-08 MED ORDER — ROSUVASTATIN CALCIUM 5 MG PO TABS: ORAL_TABLET | ORAL | 3 refills | Status: DC

## 2018-06-08 NOTE — Telephone Encounter (Signed)
Pt called and stated that Crestor needed rx on rosuvastatin and wanted to let raquel know that she is tolerating the medication well. Rx sent and patient changed pharmacy to [facility] on [address]

## 2018-07-20 NOTE — Telephone Encounter (Signed)
Spoke to patient . appointment schedule  MARCH 31 ,2020 with CV[facility].   PATIENT ONLY TAKE ROSUVASTATIN- M-W-F

## 2018-07-20 NOTE — Telephone Encounter (Signed)
Patient was started on rosuvastatin by [facility] staff in Nov 2019.  Had reportedly noted intolerance of simvastatin and this was a trial of a new medication. Initially was thought to be doing well, but now since a my chart note stating that she thinks she is having a reaction to it.  We will forward this to [facility], and arrange for her to be seen back in follow-up.  We will likely need to list rosuvastatin as an intolerance as well and perhaps move forward toward consideration of PCSK9 inhibitor.  Bryan Lemma, MD

## 2018-08-11 ENCOUNTER — Telehealth: Payer: Self-pay | Admitting: Pharmacist

## 2018-08-11 NOTE — Telephone Encounter (Signed)
Patient reports ADR to simvastatin and rosuvastatin.  Agree on virtual visit once available to discuss PCSK9i vs bempedoic acid.  Will call back to schedule virtual or tele-visit in April

## 2018-08-16 ENCOUNTER — Ambulatory Visit: Payer: Medicare Other

## 2018-09-12 ENCOUNTER — Encounter: Payer: Self-pay | Admitting: Pharmacist Clinician (PhC)/ Clinical Pharmacy Specialist

## 2018-09-13 ENCOUNTER — Telehealth: Payer: Self-pay | Admitting: Pharmacist Clinician (PhC)/ Clinical Pharmacy Specialist

## 2018-09-13 NOTE — Telephone Encounter (Signed)
Patient was last seen in [facility] clinic in November to discuss options for lowering cholesterol.  She had only tried (and failed) simvastatin at that time.  Her medical history is significant for CAD s/p STEMI with 2 overlapping DES, hypertension, PSVT, hyperlipidemia and osteoarthritis.    Her most recent lipid labs were drawn in October 2019, before she started rosuvastatin.  Showed to have TC 173, TG 85, HDL 57, LDL 99.  In the setting of a prior STEMI, she will need to have an LDL goal of < 70.    When she saw [name] Rodriguez-Guzman in November, she was started on rosuvastatin 5 mg daily.  She was able to take this daily for several weeks before she noted problems with facial flushing and muscle cramping.  She cut the dose down to three times weekly (M/W/F) for another couple of weeks, but the side effects continued.  She stopped the medication and those side effects dissipated within a week or so.    She has tried to maintain a healthy lifestyle.  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  She will join the group for 3-6 months at a time when her weight goes up, and will stop once she gets to her goal.  She then tends to maintain her weight for several years before it happens again.  Her most recent time with them was last fall and she lost 10 pounds within about 4-6 weeks. [place] days[place]  She also has a treadmill in her home and gets on that for 30 minutes a few times per week.    I then explained her options for medications at this time.  Reviewed information regarding ezetimibe, PCSK-9 inhibitors and Nexletol.   We discussed the pros and cons of each and how effective they are at lowering cholesterol.  I believe PCSK-9 inhibitor would be her best option, and because of her hesitancy, I suggested that based on her LDL at 99, she would probably be fine with the Praluent 75 mg dose.   She is unsure of Nexletol, as it is a new drug.    She would like to think about her options for a day or two.  She will reach out via MyChart before the end of the week as to which route she would prefer.    Total time spent talking with patient was approximately 35 minutes.

## 2018-09-13 NOTE — Telephone Encounter (Signed)
Thanks Kristin.  DH 

## 2019-01-17 ENCOUNTER — Telehealth: Payer: Self-pay | Admitting: *Deleted

## 2019-01-17 NOTE — Telephone Encounter (Signed)
   TELEPHONE CALL NOTE  This patient has been deemed a candidate for follow-up tele-health visit to limit community exposure during the Covid-19 pandemic. I spoke with the patient via phone to discuss instructions.  E-Visit  consent verbally confirmed.   A Virtual Office Visit appointment type has been scheduled for with 01/20/19, with dR HARDING  "VIDEO-text o  I have  confirmed the patient is active in [place], [place], RN 01/17/2019 1:22 PM

## 2019-01-20 ENCOUNTER — Encounter: Payer: Self-pay | Admitting: Cardiology

## 2019-01-20 ENCOUNTER — Telehealth (INDEPENDENT_AMBULATORY_CARE_PROVIDER_SITE_OTHER): Payer: Medicare Other | Admitting: Cardiology

## 2019-01-20 ENCOUNTER — Telehealth: Payer: Self-pay | Admitting: *Deleted

## 2019-01-20 VITALS — BP 117/78 | HR 73 | Ht 66.0 in | Wt 156.0 lb

## 2019-01-20 DIAGNOSIS — I10 Essential (primary) hypertension: Secondary | ICD-10-CM

## 2019-01-20 DIAGNOSIS — I83893 Varicose veins of bilateral lower extremities with other complications: Secondary | ICD-10-CM

## 2019-01-20 DIAGNOSIS — I471 Supraventricular tachycardia, unspecified: Secondary | ICD-10-CM

## 2019-01-20 DIAGNOSIS — E785 Hyperlipidemia, unspecified: Secondary | ICD-10-CM | POA: Diagnosis not present

## 2019-01-20 DIAGNOSIS — I493 Ventricular premature depolarization: Secondary | ICD-10-CM

## 2019-01-20 DIAGNOSIS — I872 Venous insufficiency (chronic) (peripheral): Secondary | ICD-10-CM | POA: Diagnosis not present

## 2019-01-20 DIAGNOSIS — Z955 Presence of coronary angioplasty implant and graft: Secondary | ICD-10-CM

## 2019-01-20 DIAGNOSIS — I2510 Atherosclerotic heart disease of native coronary artery without angina pectoris: Secondary | ICD-10-CM

## 2019-01-20 NOTE — Telephone Encounter (Signed)
SPOKE TO PATIENT. INSTRUCTION WAS GIVEN FROM TODAY'S VIRTUAL VISIT.  AVS SUMMARY E-SENT VIA  MYCHART. LAB SLIP WILL BE MAILED IN FEB 2021  PATIENT VERBALIZED UNDERSTANDING.

## 2019-01-20 NOTE — Progress Notes (Signed)
Virtual Visit via Video Note   This visit type was conducted due to national recommendations for restrictions regarding the COVID-19 Pandemic (e.g. social distancing) in an effort to limit this patient's exposure and mitigate transmission in our community.  Due to her co-morbid illnesses, this patient is at least at moderate risk for complications without adequate follow up.  This format is felt to be most appropriate for this patient at this time.  All issues noted in this document were discussed and addressed.  A limited physical exam was performed with this format.  Please refer to the patient's chart for her consent to telehealth for [facility].   Patient has given verbal permission to conduct this visit via virtual appointment and to bill insurance 01/21/2019 5:34 PM     Evaluation Performed:  Follow-up visit  Date:  01/21/2019   ID:  Ashley Morse, DOB 1951-02-25, MRN [id number]  Patient Location: Home Provider Location: Other:  Hospital Office  PCP:  Richmond CampbellKaplan, Kristen W., PA-C  Cardiologist:  Bryan Lemmaavid Christophor Eick, MD  Electrophysiologist:  None   Chief Complaint:  Annual F/u - CAD  History of Present Illness:    Ashley Morse is a 68 y.o. female with PMH notable for history of CAD having PCI as well as significant bilateral lower extremity venous reflux disease (multiple ablations), and hyperlipidemia (being followed by [facility], [facility]) who presents via audio/video conferencing for a telehealth visit today.  She has a history of known CAD-PCI, palpitations as well as diffuse LE Venous reflux Dz (has had extensive vein ablation etc prior to living in [place] - while in [place] -also had venous [place] done by Dr. Arbie Cookey[place]  Ashley Ramus[place] was last seen in September 2019.  She was doing quite well after switching her to a beta-blocker.  Palpitations and almost gone away.  Short little spells but nothing  significant.  No fatigue or dizziness with metoprolol.  Due to COVID-19 quarantine, her follow-up visit with [facility] our pharmacist Racquel Rodriguez-Guzman was delayed.  Adverse reactions have been noted to simvastatin and rosuvastatin the plan was to consider PCSK9 inhibitor versus Nexletol.  Follow-up appointment suggested possibly considering Praluent.  Interval History:  Ashley Morse is doing quite well.  She not having any further episodes of palpitations since starting the Toprol.  Maybe a few skipped beats here and there but nothing like she had before.  She is tolerating well without any significant fatigue.  She remains active doing lots of yard work in and walking around the neighborhood in the yard.  She has been dealing elevated with some seasonal allergies has some congestion which makes little short of breath when she tries to do a lot of walking outside, but is otherwise been doing pretty well. No major cardiac complaints of chest pain or pressure with rest or exertion.  No exertional dyspnea.  Her swelling is pretty well controlled as are the varicose veins.  Not really bothering him much she does not like wearing support stockings, nor does she like taking a diuretic.  She does elevate her feet when possible, and tries working on exercises to increase blood flow.  Cardiovascular ROS: no chest pain or dyspnea on exertion positive for - stable edema & varicose veins -- no significant heaviness / pain negative for - irregular heartbeat, murmur, orthopnea, palpitations, paroxysmal nocturnal dyspnea, rapid heart rate, shortness of breath or syncope / near syncope, TIA / amaurosis fugax), claudication  The patient does  not have symptoms concerning for COVID-19 infection (fever, chills, cough, or new shortness of breath).  The patient is practicing social distancing. Goes shopping during [employer] hour - not busy.  ROS:  Please see the history of present illness.    Review of Systems   Constitutional: Positive for weight loss (intional). Negative for malaise/fatigue.  HENT: Positive for congestion. Negative for nosebleeds.   Respiratory: Negative for cough, shortness of breath and wheezing.   Gastrointestinal: Negative for abdominal pain, blood in stool, diarrhea, heartburn, melena and nausea.  Genitourinary: Negative for hematuria.  Musculoskeletal: Negative for falls and joint pain (normal OA).  Neurological: Negative for dizziness, focal weakness, weakness and headaches.  Endo/Heme/Allergies: Positive for environmental allergies.  Psychiatric/Behavioral: Negative for memory loss. The patient does not have insomnia (usually related to eating sweets or caffeine in PM).   All other systems reviewed and are negative.   Past Medical History:  Diagnosis Date  . Bilateral venous insufficiency    Bilateral lower extremity edema,significant varicose vein; s/p vein stripping and venous abaltionof greater saph. vein  --> proved to be unsuccessful 2/2 vessel size.; Occasionally wears compression stockings during the wintertime.  cannot tolerate during the summertime;   . CAD S/P percutaneous coronary angioplasty May 2010   Distal RPDA: 2 overlapping Promus and DES 2.5 mm x 15 mm, 2.5 mm 23 mm distal to mid RCA. EF 50%. ([place], [place])   . Hemorrhoids   . History of: ST elevation myocardial infarction (STEMI) of inferior wall, subsequent episode of care May 2010   R- PDA occlusion --> PCI with DES  . Hyperlipidemia    on simvastatin stopped due to increase liver enzyme rechecked back to normal  . Hypertension   . Hypothyroidism    On levothyroxine  . Osteoarthritis   . Presence of drug coated stent in right coronary artery May 2010   See above; no longer on DAPT.  [name] Varicose veins    Past Surgical History:  Procedure Laterality Date  . CARDIAC CATHETERIZATION  10/23/2008   in [place]- for inferior STEMI, occluded RPDA  . CORONARY ANGIOPLASTY WITH STENT  PLACEMENT  10/23/2008   occulsion of RPDA  -- 2 Promus DES stent overlapping ;2.5 x 15 and 2.5 x 23 mm stents from distal RCA to PDA, ef50%  . ENDOVENOUS ABLATION SAPHENOUS VEIN W/ LASER Left 08-01-2015    endovenous laser ablation left greater saphenous vein by Gretta Began MD  . ENDOVENOUS ABLATION SAPHENOUS VEIN W/ LASER Right 08-15-2015   endovenous laser ablation right greater saphenous vein by Gretta Began MD  . left venous  duplex Left 08/31/2011   Negative for DVT  . Radiofrequency Ablation - Left Greater Saphenous Vein: Unsuccessful Left 08/31/2011  . TRANSTHORACIC ECHOCARDIOGRAM  May 2010    Echo, May 2010 - following PCI for inferior STEMI. No wall motion abnormalities, EF of 60%  . VARICOSE VEIN SURGERY Right      Current Meds  Medication Sig  . aspirin EC 81 MG tablet Take 81 mg by mouth daily.  [name] b complex vitamins tablet Take 1 tablet by mouth daily.  . cholecalciferol (VITAMIN D) 25 MCG (1000 UT) tablet Take 1 tablet by mouth daily.   . Coenzyme Q10 300 MG CAPS Take 300 mg by mouth daily. Reported on 07/02/2015  . levothyroxine (SYNTHROID, LEVOTHROID) 88 MCG tablet Take 88 mcg by mouth daily before breakfast.  . Magnesium 400 MG CAPS Take by mouth.  . metoprolol succinate (TOPROL XL)  25 MG 24 hr tablet Take 1 tablet (25 mg total) by mouth daily.  . TURMERIC PO Take 800 mg by mouth daily.     Allergies:   Benzalkonium chloride, Lactose, Lactose intolerance (gi), Lisinopril, Sulfa antibiotics, and Sulfamethoxazole-trimethoprim   Social History   Tobacco Use  . Smoking status: Never Smoker  . Smokeless tobacco: Never Used  Substance Use Topics  . Alcohol use: Yes    Alcohol/week: 1.0 standard drinks    Types: 1 Glasses of wine per week    Comment: Occasionally  . Drug use: Not on file     Family Hx: The patient's family history includes Diabetes in her sister; Heart disease in her mother; Lymphoma in her daughter; Stroke in her mother; Varicose Veins in her mother and  paternal grandfather.   Prior CV studies:   The following studies were reviewed today: . none:  Labs/Other Tests and Data Reviewed:    EKG:  No ECG reviewed.  Recent Labs: No results found for requested labs within last 8760 hours.   Recent Lipid Panel From PCP 11/30/2018:  TC 154  , TG 81,  LDL 80, HDL 56 Lab Results  Component Value Date/Time   CHOL 173 03/15/2018 09:50 AM   TRIG 85 03/15/2018 09:50 AM   HDL 57 03/15/2018 09:50 AM   CHOLHDL 3.0 03/15/2018 09:50 AM   LDLCALC 99 03/15/2018 09:50 AM    Wt Readings from Last 3 Encounters:  01/20/19 156 lb (70.8 kg)  03/31/18 158 lb 9.6 oz (71.9 kg)  02/14/18 160 lb 12.8 oz (72.9 kg)   - feels much better with wgt loss & better exercise.  Avoiding caffeine    Objective:    Vital Signs:  BP 117/78   Pulse 73   Ht 5\' 6"  (1.676 m)   Wt 156 lb (70.8 kg)   BMI 25.18 kg/m   VITAL SIGNS:  reviewed GEN:  Well nourished, well developed female in no acute distress. RESPIRATORY:  normal respiratory effort, symmetric expansion CARDIOVASCULAR:  stable LE Venous changes with stable edema MUSCULOSKELETAL:  no obvious deformities. NEURO:  alert and oriented x 3, no obvious focal deficit PSYCH:  normal affect   ASSESSMENT & PLAN:    Problem List Items Addressed This Visit    Venous insufficiency of both lower extremities (Chronic)   Unifocal PVCs    Presented standpoint PVCs seen to be relatively stable now beta-blocker.      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  Maybe she will wear support stockings more during the occluded months, does not like doing it in the summer.  Continue to elevate feet and continue calf exercises.      PSVT (paroxysmal supraventricular tachycardia) (HCC) (Chronic)    Well-controlled.  No breakthrough episodes in the last year. Reviewed vagal maneuvers.  Continue Toprol.       Presence of drug-eluting stent in right coronary artery (Chronic)    Distant  history of PCI.  No longer on Thienopyridine.  Remains on aspirin.      Hyperlipidemia with target LDL less than 70; intolerant of statin in the past due to elevated LFTs (Chronic)    LDL is at 80 based on last check.  She did not tolerate even low doses of rosuvastatin and and is reluctant on trying a thing else.  She does not want to do any injections or other medications.  She would like to try to just do something with natural therapy  as is been the case with her she is not been on medications.  I recommended trying red yeast rice.  We can reevaluate labs in about 6 months.  If at that time she is not at goal, we may need to consider something like at least Nexletol.      Relevant Orders   Lipid panel   Comprehensive metabolic panel   Essential hypertension (Chronic)    Doing well with Toprol.  Blood pressure looks good.      Coronary artery disease involving native coronary artery of native heart without angina pectoris - Primary (Chronic)    Distant history of an inferior MI with known documented inferior hypokinesis and EF roughly 50%.  No recurrent anginal symptoms.  Plan: Continue Toprol and aspirin. Not on statin because of intolerance.   Recommend starting get yeast rice       Relevant Orders   Lipid panel   Comprehensive metabolic panel      COVID-19 Education: The signs and symptoms of COVID-19 were discussed with the patient and how to seek care for testing (follow up with PCP or arrange E-visit).   The importance of social distancing was discussed today.  Time:   Today, I have spent 23 minutes with the patient with telehealth technology discussing the above problems.    Medication Adjustments/Labs and Tests Ordered: Current medicines are reviewed at length with the patient today.  Concerns regarding medicines are outlined above.   Patient Instructions  Medication Instructions:   Recommend starting Red Yeast Rice supplement  & recheck cholesterol in ~ 6 month.   If you need a refill on your cardiac medications before your next appointment, please call your pharmacy.   Lab work:  - If PCP has not checked Lipid panel by March 2021 - we will order.  If you have labs (blood work) drawn today and your tests are completely normal, you will receive your results only by: [name]. MyChart Message (if you have MyChart) OR . A paper copy in the mail If you have any lab test that is abnormal or we need to change your treatment, we will call you to review the results.  Testing/Procedures: none  Follow-Up: At [facility], you and your health needs are our priority.  As part of our continuing mission to provide you with exceptional heart care, we have created designated Provider Care Teams.  These Care Teams include your primary Cardiologist (physician) and Advanced Practice Providers (APPs -  Physician Assistants and Nurse Practitioners) who all work together to provide you with the care you need, when you need it. . You will need a follow up appointment in   12 months- SEPT 2021.  Please call our office 2 months in advance to schedule this appointment.  You may see Bryan Lemmaavid Philomina Leon, MD or one of the following Advanced Practice Providers on your designated Care Team:   . Theodore DemarkRhonda Barrett, PA-C . Joni ReiningKathryn Lawrence, DNP, ANP  Any Other Special Instructions Will Be Listed Below (If Applicable).   Signed, Bryan Lemmaavid Xxavier Noon, MD  01/21/2019 5:34 PM    [facility]

## 2019-01-20 NOTE — Patient Instructions (Addendum)
Medication Instructions:   Recommend starting Red Yeast Rice supplement  & recheck cholesterol in ~ 6 month.  If you need a refill on your cardiac medications before your next appointment, please call your pharmacy.   Lab work:  - If PCP has not checked Lipid panel by March 2021 - we will order.  If you have labs (blood work) drawn today and your tests are completely normal, you will receive your results only by: [name] MyChart Message (if you have MyChart) OR . A paper copy in the mail If you have any lab test that is abnormal or we need to change your treatment, we will call you to review the results.  Testing/Procedures: none  Follow-Up: At [facility], you and your health needs are our priority.  As part of our continuing mission to provide you with exceptional heart care, we have created designated Provider Care Teams.  These Care Teams include your primary Cardiologist (physician) and Advanced Practice Providers (APPs -  Physician Assistants and Nurse Practitioners) who all work together to provide you with the care you need, when you need it. . You will need a follow up appointment in   12 months- SEPT 2021.  Please call our office 2 months in advance to schedule this appointment.  You may see Glenetta Hew, MD or one of the following Advanced Practice Providers on your designated Care Team:   . Rosaria Ferries, PA-C . [name], DNP, ANP  Any Other Special Instructions Will Be Listed Below (If Applicable).

## 2019-01-21 ENCOUNTER — Encounter: Payer: Self-pay | Admitting: Cardiology

## 2019-01-21 NOTE — Assessment & Plan Note (Signed)
Presented standpoint PVCs seen to be relatively stable now beta-blocker.

## 2019-01-21 NOTE — Assessment & Plan Note (Signed)
Distant history of an inferior MI with known documented inferior hypokinesis and EF roughly 50%.  No recurrent anginal symptoms.  Plan: Continue Toprol and aspirin. Not on statin because of intolerance.   Recommend starting get yeast rice

## 2019-01-21 NOTE — Assessment & Plan Note (Signed)
Well-controlled.  No breakthrough episodes in the last year. Reviewed vagal maneuvers.  Continue Toprol.

## 2019-01-21 NOTE — Assessment & Plan Note (Signed)
Stable.  Maybe she will wear support stockings more during the occluded months, does not like doing it in the summer.  Continue to elevate feet and continue calf exercises.

## 2019-01-21 NOTE — Assessment & Plan Note (Signed)
Distant history of PCI.  No longer on Thienopyridine.  Remains on aspirin.

## 2019-01-21 NOTE — Assessment & Plan Note (Signed)
Doing well with Toprol.  Blood pressure looks good.

## 2019-01-21 NOTE — Assessment & Plan Note (Signed)
LDL is at 80 based on last check.  She did not tolerate even low doses of rosuvastatin and and is reluctant on trying a thing else.  She does not want to do any injections or other medications.  She would like to try to just do something with natural therapy as is been the case with her she is not been on medications.  I recommended trying red yeast rice.  We can reevaluate labs in about 6 months.  If at that time she is not at goal, we may need to consider something like at least Nexletol.

## 2019-02-27 DIAGNOSIS — I471 Supraventricular tachycardia: Secondary | ICD-10-CM

## 2019-02-27 MED ORDER — METOPROLOL SUCCINATE ER 25 MG PO TB24: 25.0000 mg | ORAL_TABLET | Freq: Every day | ORAL | 3 refills | Status: DC

## 2019-05-25 DIAGNOSIS — I471 Supraventricular tachycardia: Secondary | ICD-10-CM

## 2019-05-26 MED ORDER — METOPROLOL SUCCINATE ER 25 MG PO TB24: 25.0000 mg | ORAL_TABLET | Freq: Every day | ORAL | 2 refills | Status: DC

## 2019-06-13 ENCOUNTER — Ambulatory Visit: Payer: Medicare Other

## 2019-06-22 ENCOUNTER — Ambulatory Visit: Payer: Medicare Other | Attending: Internal Medicine

## 2019-06-22 DIAGNOSIS — Z23 Encounter for immunization: Secondary | ICD-10-CM | POA: Insufficient documentation

## 2019-06-22 NOTE — Progress Notes (Signed)
   Covid-19 Vaccination Clinic  Name:  Ashley Morse    MRN: [id number] DOB: 1951/03/31  06/22/2019  [name] was observed post Covid-19 immunization for 15 minutes without incidence. She was provided with Vaccine Information Sheet and instruction to access the V-Safe system.   [name] was instructed to call 911 with any severe reactions post vaccine: [name] Difficulty breathing  . Swelling of your face and throat  . A fast heartbeat  . A bad rash all over your body  . Dizziness and weakness    Immunizations Administered    Name Date Dose VIS Date Route   Pfizer COVID-19 Vaccine 06/22/2019  4:25 PM 0.3 mL 04/28/2019 Intramuscular   Manufacturer: [employer], [employer]   Lot: [id number]   NDC: [address]-5

## 2019-07-04 ENCOUNTER — Ambulatory Visit: Payer: Medicare Other

## 2019-07-17 ENCOUNTER — Ambulatory Visit: Payer: Medicare Other | Attending: Internal Medicine

## 2019-07-17 DIAGNOSIS — Z23 Encounter for immunization: Secondary | ICD-10-CM | POA: Insufficient documentation

## 2019-07-17 NOTE — Progress Notes (Signed)
   Covid-19 Vaccination Clinic  Name:  Ashley Morse    MRN: [id number] DOB: [birth year]  07/17/2019  [name] was observed post Covid-19 immunization for 15 minutes without incidence. She was provided with Vaccine Information Sheet and instruction to access the V-Safe system.   [name] was instructed to call 911 with any severe reactions post vaccine: [name] Difficulty breathing  . Swelling of your face and throat  . A fast heartbeat  . A bad rash all over your body  . Dizziness and weakness    Immunizations Administered    Name Date Dose VIS Date Route   Pfizer COVID-19 Vaccine 07/17/2019  4:42 PM 0.3 mL 04/28/2019 Intramuscular   Manufacturer: [employer], [employer]   Lot: [id number]   NDC: [address]-9

## 2019-09-06 ENCOUNTER — Ambulatory Visit: Payer: Medicare Other | Admitting: Orthopaedic Surgery

## 2019-10-06 ENCOUNTER — Ambulatory Visit (INDEPENDENT_AMBULATORY_CARE_PROVIDER_SITE_OTHER): Payer: Medicare Other | Admitting: Orthopaedic Surgery

## 2019-10-06 ENCOUNTER — Telehealth: Payer: Self-pay | Admitting: Orthopaedic Surgery

## 2019-10-06 ENCOUNTER — Ambulatory Visit (INDEPENDENT_AMBULATORY_CARE_PROVIDER_SITE_OTHER): Payer: Medicare Other

## 2019-10-06 ENCOUNTER — Other Ambulatory Visit: Payer: Self-pay

## 2019-10-06 ENCOUNTER — Encounter: Payer: Self-pay | Admitting: Orthopaedic Surgery

## 2019-10-06 VITALS — Ht 65.0 in | Wt 160.0 lb

## 2019-10-06 DIAGNOSIS — M5412 Radiculopathy, cervical region: Secondary | ICD-10-CM | POA: Diagnosis not present

## 2019-10-06 MED ORDER — PREDNISONE 10 MG (21) PO TBPK: ORAL_TABLET | ORAL | 0 refills | Status: DC

## 2019-10-06 NOTE — Progress Notes (Signed)
Office Visit Note   Patient: Ashley Morse           Date of Birth: April 06, 1951           MRN: [id number] Visit Date: 10/06/2019              Requested by: Richmond Campbell., PA-C [address]  [place] [address] PCP: Richmond Campbell., PA-C   Assessment & Plan: Visit Diagnoses:  1. Cervical radiculopathy     Plan: Impression is left shoulder pain with unclear etiology as to whether it is coming from her neck or actually her shoulder.  Based on our discussion patient would like to try a prednisone taper and some home exercises for the shoulder.  She will let us know if she continues to be symptomatic.  Follow-Up Instructions: Return if symptoms worsen or fail to improve.   Orders:  Orders Placed This Encounter  Procedures  . XR Cervical Spine 2 or 3 views   Meds ordered this encounter  Medications  . predniSONE (STERAPRED UNI-PAK 21 TAB) 10 MG (21) TBPK tablet    Sig: Take as directed    Dispense:  21 tablet    Refill:  0      Procedures: No procedures performed   Clinical Data: No additional findings.   Subjective: Chief Complaint  Patient presents with  . Left Shoulder - Pain    Keyonta is a 69 year old female comes in for evaluation of 3 months of left shoulder pain without definite trauma.  She feels weakness in her left arm especially with elevation.  Denies any previous surgeries.  She reports some numbness and tingling in the upper extremity and radiation of pain from her neck but the pain is also worse with elevation of the arm above her head.  She is right-hand dominant and takes Advil and Aleve as needed.   Review of Systems  Constitutional: Negative.   HENT: Negative.   Eyes: Negative.   Respiratory: Negative.   Cardiovascular: Negative.   Endocrine: Negative.   Musculoskeletal: Negative.   Neurological: Negative.   Hematological: Negative.   Psychiatric/Behavioral: Negative.   All other systems reviewed and are  negative.    Objective: Vital Signs: Ht 5\' 5"  (1.651 m)   Wt 160 lb (72.6 kg)   BMI 26.63 kg/m   Physical Exam Vitals and nursing note reviewed.  Constitutional:      Appearance: She is well-developed.  HENT:     Head: Normocephalic and atraumatic.  Pulmonary:     Effort: Pulmonary effort is normal.  Abdominal:     Palpations: Abdomen is soft.  Musculoskeletal:     Cervical back: Neck supple.  Skin:    General: Skin is warm.     Capillary Refill: Capillary refill takes less than 2 seconds.  Neurological:     Mental Status: She is alert and oriented to person, place, and time.  Psychiatric:        Behavior: Behavior normal.        Thought Content: Thought content normal.        Judgment: Judgment normal.     Ortho Exam Left shoulder shows mild Hawkins and mild empty can.  Negative Spurling.  Reproducible pain with reaching up above head.  Range of motion is normal.  Cervical spine is nontender. Specialty Comments:  No specialty comments available.  Imaging: XR Cervical Spine 2 or 3 views  Result Date: 10/06/2019 Moderate cervical degenerative disc disease and spondylosis  PMFS History: Patient Active Problem List   Diagnosis Date Noted  . Cervical radiculopathy 10/06/2019  . Unifocal PVCs 12/08/2017  . PSVT (paroxysmal supraventricular tachycardia) ([place]) 12/08/2017  . Subjective carotid bruit - Prominent R carotid pulseation - ? aneurysmal dilation 04/14/2017  . Symptomatic varicose veins of both lower extremities 07/02/2015  . Rapid heart beat 01/25/2015  . Osteoarthritis   . Hyperlipidemia with target LDL less than 70; intolerant of statin in the past due to elevated LFTs   . Essential hypertension   . Venous insufficiency of both lower extremities   . Presence of drug-eluting stent in right coronary artery 10/23/2008  . History of acute inferior wall MI 09/28/2008  . Coronary artery disease involving native coronary artery of native heart without  angina pectoris 09/15/2008   Past Medical History:  Diagnosis Date  . Bilateral venous insufficiency    Bilateral lower extremity edema,significant varicose vein; s/p vein stripping and venous abaltionof greater saph. vein  --> proved to be unsuccessful 2/2 vessel size.; Occasionally wears compression stockings during the wintertime.  cannot tolerate during the summertime;   . CAD S/P percutaneous coronary angioplasty May 2010   Distal RPDA: 2 overlapping Promus and DES 2.5 mm x 15 mm, 2.5 mm 23 mm distal to mid RCA. EF 50%. ([place], [place])   . Hemorrhoids   . History of: ST elevation myocardial infarction (STEMI) of inferior wall, subsequent episode of care May 2010   R- PDA occlusion --> PCI with DES  . Hyperlipidemia    on simvastatin stopped due to increase liver enzyme rechecked back to normal  . Hypertension   . Hypothyroidism    On levothyroxine  . Osteoarthritis   . Presence of drug coated stent in right coronary artery May 2010   See above; no longer on DAPT.  [name] Varicose veins     Family History  Problem Relation Age of Onset  . Stroke Mother   . Heart disease Mother   . Varicose Veins Mother   . Diabetes Sister   . Lymphoma Daughter        Lymphoma  . Varicose Veins Paternal Grandfather     Past Surgical History:  Procedure Laterality Date  . CARDIAC CATHETERIZATION  10/23/2008   in [place]- for inferior STEMI, occluded RPDA  . CORONARY ANGIOPLASTY WITH STENT PLACEMENT  10/23/2008   occulsion of RPDA  -- 2 Promus DES stent overlapping ;2.5 x 15 and 2.5 x 23 mm stents from distal RCA to PDA, ef50%  . ENDOVENOUS ABLATION SAPHENOUS VEIN W/ LASER Left 08-01-2015    endovenous laser ablation left greater saphenous vein by Curt Jews MD  . ENDOVENOUS ABLATION SAPHENOUS VEIN W/ LASER Right 08-15-2015   endovenous laser ablation right greater saphenous vein by Curt Jews MD  . left venous  duplex Left 08/31/2011   Negative for DVT  . Radiofrequency Ablation -  Left Greater Saphenous Vein: Unsuccessful Left 08/31/2011  . TRANSTHORACIC ECHOCARDIOGRAM  May 2010    Echo, May 2010 - following PCI for inferior STEMI. No wall motion abnormalities, EF of 60%  . VARICOSE VEIN SURGERY Right    Social History   Occupational History  . Not on file  Tobacco Use  . Smoking status: Never Smoker  . Smokeless tobacco: Never Used  Substance and Sexual Activity  . Alcohol use: Yes    Alcohol/week: 1.0 standard drinks    Types: 1 Glasses of wine per week    Comment: Occasionally  . Drug  use: Not on file  . Sexual activity: Not on file

## 2019-10-06 NOTE — Telephone Encounter (Signed)
Patient was very concerned about prednisone taper as her daughter told her she would not be able to sleep if she did not take it before 3pm and the med amps her up. I explained that she could take the medication at a different taper if she felt that this was too much and would bother her when she tries to sleep. Patient is going to try 4, 4, 4, 3, 3, 2, 1 taper to see if her body can handle that better. She will call back if continued problems or questions.

## 2019-10-06 NOTE — Telephone Encounter (Signed)
Patient called.   She is requesting a call back to raise a question about the medicine she was prescribed today and how she should be taking it.   Call back: [phone]

## 2019-12-26 ENCOUNTER — Telehealth: Payer: Self-pay | Admitting: *Deleted

## 2019-12-26 NOTE — Telephone Encounter (Signed)
Mrs.Philipp, will call back and schedule her appointment with Dr.Harding.

## 2020-02-14 ENCOUNTER — Ambulatory Visit (INDEPENDENT_AMBULATORY_CARE_PROVIDER_SITE_OTHER): Payer: Medicare Other | Admitting: Cardiology

## 2020-02-14 ENCOUNTER — Encounter: Payer: Self-pay | Admitting: Cardiology

## 2020-02-14 ENCOUNTER — Other Ambulatory Visit: Payer: Self-pay

## 2020-02-14 VITALS — BP 124/72 | HR 65 | Temp 97.1°F | Ht 66.0 in | Wt 168.0 lb

## 2020-02-14 DIAGNOSIS — I10 Essential (primary) hypertension: Secondary | ICD-10-CM

## 2020-02-14 DIAGNOSIS — I471 Supraventricular tachycardia: Secondary | ICD-10-CM

## 2020-02-14 DIAGNOSIS — I2510 Atherosclerotic heart disease of native coronary artery without angina pectoris: Secondary | ICD-10-CM | POA: Diagnosis not present

## 2020-02-14 DIAGNOSIS — E785 Hyperlipidemia, unspecified: Secondary | ICD-10-CM | POA: Diagnosis not present

## 2020-02-14 DIAGNOSIS — I252 Old myocardial infarction: Secondary | ICD-10-CM

## 2020-02-14 DIAGNOSIS — Z955 Presence of coronary angioplasty implant and graft: Secondary | ICD-10-CM

## 2020-02-14 DIAGNOSIS — I872 Venous insufficiency (chronic) (peripheral): Secondary | ICD-10-CM

## 2020-02-14 MED ORDER — NEXLETOL 180 MG PO TABS: 180.0000 mg | ORAL_TABLET | Freq: Every day | ORAL | 3 refills | Status: DC

## 2020-02-14 NOTE — Patient Instructions (Addendum)
Medication Instructions:   Start taking  Nexletol 180 mg one tablet a day - take for one month  ( samples ) contact office if no issues we will send in a prescription  *If you need a refill on your cardiac medications before your next appointment, please call your pharmacy*   Lab Work: Labs in  6 month  Lipid , cmp  Fasting   If you have labs (blood work) drawn today and your tests are completely normal, you will receive your results only by:  MyChart Message (if you have MyChart) OR  A paper copy in the mail If you have any lab test that is abnormal or we need to change your treatment, we will call you to review the results.   Testing/Procedures: Not needed   Follow-Up: At [facility], you and your health needs are our priority.  As part of our continuing mission to provide you with exceptional heart care, we have created designated Provider Care Teams.  These Care Teams include your primary Cardiologist (physician) and Advanced Practice Providers (APPs -  Physician Assistants and Nurse Practitioners) who all work together to provide you with the care you need, when you need it.      Your next appointment:   12 month(s)  The format for your next appointment:   In Person  Provider:   Bryan Lemma, MD   Other Instructions- if fast heart ratr Recommendations for vagal maneuvers:  "Bearing down"  Coughing  Gagging  Icy, cold towel on face or drink ice cold water Or you can try  CARTIOD MASSAGE

## 2020-02-14 NOTE — Progress Notes (Signed)
Primary Care Provider: Richmond Campbell., PA-C Cardiologist: Bryan Lemma, MD Electrophysiologist: None  Clinic Note: Chief Complaint  Patient presents with  . Follow-up    Annual  . Tachycardia    1 episode on July 11  . Coronary Artery Disease    No angina    HPI:    Wynema Garoutte is a 69 y.o. female with a PMH notable for CAD having PCI and significant BLE venous reflux swelling (status post multiple ablations), HLD--(followed by [facility]) who presents today for annual follow-up.   History of CAD having PCI (while living in [place])  History of extensive bilateral LE venous reflux disease--extensive venous ablation in [place] as well as venous ablation here by Dr. Darnelle Bos was last seen on January 20, 2019 via telemedicine -> apparently her follow-up visits with [facility] pharmacists were delayed and she had not yet been started on Praluent.  She has been intolerant of statins. --> She was doing well no significant palpitations since starting Toprol.  Just a few skipped beats.  Still doing yard work and walking in the neighborhood.  Noted some allergies.  No chest pain.  Stable edema.  Only tolerating wearing support stockings during cooler weather.  She had lost some weight intentionally.  Intermittent insomnia.  Recent Hospitalizations: None  Reviewed  CV studies:    The following studies were reviewed today: (if available, images/films reviewed: From Epic Chart or Care Everywhere) . None:  Interval History:   Kennis Buell returns here today overall doing pretty well from a cardiac standpoint.  She has pretty much stayed isolated at home she [place] and is been pretty much socially distant.  Overall she had been doing relatively well, but because of her hot intolerance she has had some issues in the heat of the summer.  There was an episode in July -> on 11 July it was a hot day and she was out in the yard and started noticing her heart  rate went up to about the a hundred twenties to a hundred sixties for about 20 min at a time.  She went into the cool air conditioned house and it still took about 10 min for her heart rate to come down.  Ever since then she is try to avoid being out in the hot sun for prolonged times.  Is not going outside as much.  When her heart rate fast she just felt unusual but did not necessarily have any syncope or near syncopal type symptoms.  She denied any chest pain.  CV Review of Symptoms (Summary): no chest pain or dyspnea on exertion positive for - edema, rapid heart rate and Swelling is pretty stable.  She does not like wearing support stockings overheat but wears them and is cooler.  She does elevate her feet.  She is not being bothered too much by neuropathy or aching in the legs. negative for - orthopnea, paroxysmal nocturnal dyspnea, shortness of breath or Syncope or near syncope, TIA/amaurosis fugax, claudication  The patient does not have symptoms concerning for COVID-19 infection (fever, chills, cough, or new shortness of breath).   REVIEWED OF SYSTEMS   Review of Systems  Constitutional: Negative for malaise/fatigue and weight loss (The, she gained back some of the weight over the summer because of the heat and not going outside as much.).  HENT: Negative for congestion.   Respiratory: Negative for shortness of breath.   Cardiovascular: Positive for leg swelling (Chronic.  Stable).  Gastrointestinal: Negative for abdominal pain, blood in stool and melena.  Genitourinary: Negative for hematuria.  Musculoskeletal: Negative for falls and joint pain.  Neurological: Positive for dizziness (Only during that episode of tachycardia). Negative for focal weakness and weakness.  Psychiatric/Behavioral: Negative.    I have reviewed and (if needed) personally updated the patient's problem list, medications, allergies, past medical and surgical history, social and family history.   PAST MEDICAL  HISTORY   Past Medical History:  Diagnosis Date  . Bilateral venous insufficiency    Bilateral lower extremity edema,significant varicose vein; s/p vein stripping and venous abaltionof greater saph. vein  --> proved to be unsuccessful 2/2 vessel size.; Occasionally wears compression stockings during the wintertime.  cannot tolerate during the summertime;   . CAD S/P percutaneous coronary angioplasty May 2010   Distal RPDA: 2 overlapping Promus and DES 2.5 mm x 15 mm, 2.5 mm 23 mm distal to mid RCA. EF 50%. ([place], [place]   . Hemorrhoids   . History of: ST elevation myocardial infarction (STEMI) of inferior wall, subsequent episode of care May 2010   R- PDA occlusion --> PCI with DES  . Hyperlipidemia    on simvastatin stopped due to increase liver enzyme rechecked back to normal  . Hypertension   . Hypothyroidism    On levothyroxine  . Osteoarthritis   . Presence of drug coated stent in right coronary artery May 2010   See above; no longer on DAPT.  [name]. Varicose veins     Immunization History  Administered Date(s) Administered  . PFIZER SARS-COV-2 Vaccination 06/22/2019, 07/17/2019    PAST SURGICAL HISTORY   Past Surgical History:  Procedure Laterality Date  . CARDIAC CATHETERIZATION  10/23/2008   in [place]- for inferior STEMI, occluded RPDA  . CORONARY ANGIOPLASTY WITH STENT PLACEMENT  10/23/2008   occulsion of RPDA  -- 2 Promus DES stent overlapping ;2.5 x 15 and 2.5 x 23 mm stents from distal RCA to PDA, ef50%  . ENDOVENOUS ABLATION SAPHENOUS VEIN W/ LASER Left 08-01-2015    endovenous laser ablation left greater saphenous vein by Gretta Beganodd Early MD  . ENDOVENOUS ABLATION SAPHENOUS VEIN W/ LASER Right 08-15-2015   endovenous laser ablation right greater saphenous vein by Gretta Beganodd Early MD  . left venous  duplex Left 08/31/2011   Negative for DVT  . Radiofrequency Ablation - Left Greater Saphenous Vein: Unsuccessful Left 08/31/2011  . TRANSTHORACIC ECHOCARDIOGRAM  May 2010     Echo, May 2010 - following PCI for inferior STEMI. No wall motion abnormalities, EF of 60%  . VARICOSE VEIN SURGERY Right   And anemia MEDICATIONS/ALLERGIES   Current Meds  Medication Sig  . ascorbic acid (VITAMIN C) 100 MG tablet   . aspirin EC 81 MG tablet Take 81 mg by mouth daily.  [name]. b complex vitamins tablet Take 1 tablet by mouth daily.  . cholecalciferol (VITAMIN D) 25 MCG (1000 UT) tablet Take 1 tablet by mouth daily.   . Coenzyme Q10 300 MG CAPS Take 300 mg by mouth daily. Reported on 07/02/2015  . estradiol (ESTRACE) 0.1 MG/GM vaginal cream Insert 0.5 grams nightly for 2 weeks, then 0.5 grams twice weekly  . latanoprost (XALATAN) 0.005 % ophthalmic solution 1 drop at bedtime.  [name]. levothyroxine (SYNTHROID, LEVOTHROID) 88 MCG tablet Take 88 mcg by mouth daily before breakfast.  . Magnesium 400 MG CAPS Take by mouth.  . Melatonin 5 MG CAPS Take 1 tablet by mouth at bedtime.  . metoprolol  tartrate (LOPRESSOR) 25 MG tablet   . TURMERIC PO Take 800 mg by mouth daily.  . [DISCONTINUED] metoprolol succinate (TOPROL XL) 25 MG 24 hr tablet Take 1 tablet (25 mg total) by mouth daily.    Allergies  Allergen Reactions  . Benzalkonium Chloride Other (See Comments)    unknown  . Lactose Other (See Comments)    unknown  . Lactose Intolerance (Gi)   . Lisinopril Other (See Comments)    unknown  . Statins     Atorvastatin,simvastain. Rosuvastatin   . Sulfa Antibiotics Swelling    Skin discoloration  . Sulfamethoxazole-Trimethoprim     Other reaction(s): Other (See Comments) unknown  . Zetia [Ezetimibe]   . Prednisone Rash    Rash/swelling    SOCIAL HISTORY/FAMILY HISTORY   Reviewed in Epic:  Pertinent findings: N/A  OBJCTIVE -PE, EKG, labs   Wt Readings from Last 3 Encounters:  02/14/20 168 lb (76.2 kg)  10/06/19 160 lb (72.6 kg)  01/20/19 156 lb (70.8 kg)    Physical Exam: BP 124/72   Pulse 65   Temp (!) 97.1 F (36.2 C)   Ht  (1.676 m)   Wt 168 lb (76.2  kg)   SpO2 94%   BMI 27.12 kg/m  Physical Exam Vitals reviewed.  Constitutional:      General: She is not in acute distress.    Appearance: Normal appearance. She is normal weight. She is not ill-appearing.  Neck:     Vascular: No carotid bruit, hepatojugular reflux or JVD.  Cardiovascular:     Rate and Rhythm: Normal rate and regular rhythm.  No extrasystoles are present.    Chest Wall: PMI is not displaced.     Pulses: Intact distal pulses.     Heart sounds: Normal heart sounds. No murmur heard.  No friction rub. No gallop.   Pulmonary:     Effort: Pulmonary effort is normal. No respiratory distress.     Breath sounds: Normal breath sounds.  Chest:     Chest wall: No tenderness.  Musculoskeletal:        General: Normal range of motion.     Cervical back: Normal range of motion.  Skin:    Comments: Diffuse spider veins/small varicose veins in the ankles and feet on both legs.  Less prominent spider veins and more varicose veins in the lower legs to mid thighs.  Minimal true venous stasis change.  Neurological:     General: No focal deficit present.     Mental Status: She is alert and oriented to person, place, and time.  Psychiatric:        Mood and Affect: Mood normal.        Thought Content: Thought content normal.        Judgment: Judgment normal.       Adult ECG Report  Rate: 65 ;  Rhythm: normal sinus rhythm and Normal axis, normal durations.;   Narrative Interpretation: Normal EKG.  Recent Labs: 12/26/2019   Ref Range & Units 12/26/2019  LDL Direct <130 mg/dL 94   Total Cholesterol 25 - 199 MG/DL 578   Triglycerides 10 - 150 MG/DL 81   HDL Cholesterol 35 - 135 MG/DL 58   Non-HDL Cholesterol MG/DL 469    Sodium 629 - 528 MMOL/L 140   Potassium 3.5 - 5.3 MMOL/L 4.0   Chloride 98 - 110 MMOL/L 105   CO2 23 - 30 MMOL/L 26   BUN 8 - 24 MG/DL 19  Glucose 70 - 99 MG/DL 81   Creatinine 0.81 - 1.50 MG/DL 4.48   Calcium 8.5 - 18.5 MG/DL 9.1   Total Protein 6.0 -  8.3 G/DL 7.6   Albumin  3.5 - 5.0 G/DL 3.9   Total Bilirubin 0.1 - 1.2 MG/DL 0.6   Alkaline Phosphatase 25 - 125 IU/L or U/L 54   AST (SGOT) 5 - 40 IU/L or U/L 42High   ALT (SGPT) 5 - 50 IU/L or U/L 34   Anion Gap 4 - 14 MMOL/L 9   Est. GFR Non-African American >=60 ML/MIN/1.73 M*2  88    WBC 4.4 - 11.0 x 10*3/uL 4.8   RBC 4.10 - 5.10 x 10*6/uL 4.35   Hemoglobin 12.3 - 15.3 G/DL 63.1   Hematocrit 49.7 - 44.6 % 40.0   Platelets 150 - 450 X 10*3/uL 213     Lab Results  Component Value Date   CHOL 173 03/15/2018   HDL 57 03/15/2018   LDLCALC 99 03/15/2018   TRIG 85 03/15/2018   CHOLHDL 3.0 03/15/2018   Lab Results  Component Value Date   CREATININE 0.75 07/05/2016   BUN 13 07/05/2016   NA 138 07/05/2016   K 3.9 07/05/2016   CL 105 07/05/2016   CO2 23 07/05/2016   No results found for: TSH  ASSESSMENT/PLAN    Problem List Items Addressed This Visit    Coronary artery disease involving native coronary artery of native heart without angina pectoris (Chronic)    Distant history of inferior STEMI with PCI.  She does have significant hypokinesis noted on echo but preserved EF.  No recurrent anginal symptoms or heart failure symptoms.  She is on overall stable regimen.  Plan:  Continue Toprol and aspirin.  Not currently on statin because of intolerance--we will initiate Nexletol consider referral back to [facility].  With relatively normal blood pressure, has not required ARB.      Relevant Medications   metoprolol tartrate (LOPRESSOR) 25 MG tablet   Bempedoic Acid (NEXLETOL) 180 MG TABS   Other Relevant Orders   EKG 12-Lead (Completed)   Lipid panel   Comprehensive metabolic panel   Venous insufficiency of both lower extremities (Chronic)    Stable.  Continue to wear stockings when able and foot elevation.      Relevant Medications   metoprolol tartrate (LOPRESSOR) 25 MG tablet   Bempedoic Acid (NEXLETOL) 180 MG TABS   History of acute inferior wall MI (Chronic)     Distant history of RCA PCI in the setting inferior STEMI with no residual wall motion normality on echo and preserved EF.   Has been on stable regimen with no recurrent angina or heart failure symptoms.      Relevant Orders   Comprehensive metabolic panel   Hyperlipidemia with target LDL less than 70; intolerant of statin in the past due to elevated LFTs - Primary (Chronic)    She had elevated LFTs in the setting of statin use.  Reluctant to try another statin because of toxicity issues.  She was not interested in injections, but is agreeable to try Nexletol which is the only medication she has not yet tried.      Relevant Medications   metoprolol tartrate (LOPRESSOR) 25 MG tablet   Bempedoic Acid (NEXLETOL) 180 MG TABS   Other Relevant Orders   Lipid panel   Comprehensive metabolic panel   Essential hypertension (Chronic)    Doing well with current dose of Toprol.  Relevant Medications   metoprolol tartrate (LOPRESSOR) 25 MG tablet   Bempedoic Acid (NEXLETOL) 180 MG TABS   PSVT (paroxysmal supraventricular tachycardia) (HCC) (Chronic)    Only one breakthrough episode this summer in the heat.  Probably exacerbated by heat and dehydration.  Spontaneously resolved without any adverse event.  She does have as needed Lopressor which she did not have a chance to take because the episode resolved before she can get it.  Again we talked about vagal maneuvers and maintaining adequate hydration.      Relevant Medications   metoprolol tartrate (LOPRESSOR) 25 MG tablet   Bempedoic Acid (NEXLETOL) 180 MG TABS   Other Relevant Orders   EKG 12-Lead (Completed)   Presence of drug-eluting stent in right coronary artery (Chronic)    Distant history of PCI.  Remains on maintenance dose aspirin.  No longer on Thienopyridine antiplatelet agent.      Relevant Orders   EKG 12-Lead (Completed)   Comprehensive metabolic panel       COVID-19 Education: The signs and symptoms of COVID-19  were discussed with the patient and how to seek care for testing (follow up with PCP or arrange E-visit).   The importance of social distancing and COVID-19 vaccination was discussed today.  The patient is practicing social distancing & Masking.   I spent a total of with the patient spent in direct patient consultation.  Additional time spent with chart review  / charting (studies, outside notes, etc): 8 Total Time: 33 min   Current medicines are reviewed at length with the patient today.  (+/- concerns) n/a  Notice: This dictation was prepared with Dragon dictation along with smaller phrase technology. Any transcriptional errors that result from this process are unintentional and may not be corrected upon review.  Patient Instructions / Medication Changes & Studies & Tests Ordered   Patient Instructions  Medication Instructions:   Start taking  Nexletol 180 mg one tablet a day - take for one month  ( samples ) contact office if no issues we will send in a prescription  *If you need a refill on your cardiac medications before your next appointment, please call your pharmacy*   Lab Work: Labs in  6 month  Lipid , cmp  Fasting   If you have labs (blood work) drawn today and your tests are completely normal, you will receive your results only by: [name] MyChart Message (if you have MyChart) OR . A paper copy in the mail If you have any lab test that is abnormal or we need to change your treatment, we will call you to review the results.   Testing/Procedures: Not needed   Follow-Up: At [facility], you and your health needs are our priority.  As part of our continuing mission to provide you with exceptional heart care, we have created designated Provider Care Teams.  These Care Teams include your primary Cardiologist (physician) and Advanced Practice Providers (APPs -  Physician Assistants and Nurse Practitioners) who all work together to provide you with the care you need,  when you need it.      Your next appointment:   12 month(s)  The format for your next appointment:   In Person  Provider:   Bryan Lemma, MD   Other Instructions- if fast heart ratr Recommendations for vagal maneuvers:  "Bearing down"  Coughing  Gagging  Icy, cold towel on face or drink ice cold water Or you can try  CARTIOD MASSAGE  Studies Ordered:   Orders Placed This Encounter  Procedures  . Lipid panel  . Comprehensive metabolic panel  . EKG 12-Lead     Bryan Lemma, M.D., M.S. Interventional Cardiologist   Pager # [phone] Phone # [phone] [address] [place], [place] [address]   Thank you for choosing Heart[facility] at [facility]!!

## 2020-02-20 ENCOUNTER — Other Ambulatory Visit: Payer: Self-pay | Admitting: Cardiology

## 2020-02-20 DIAGNOSIS — I471 Supraventricular tachycardia: Secondary | ICD-10-CM

## 2020-02-22 ENCOUNTER — Encounter: Payer: Self-pay | Admitting: Cardiology

## 2020-02-22 NOTE — Assessment & Plan Note (Signed)
Only one breakthrough episode this summer in the heat.  Probably exacerbated by heat and dehydration.  Spontaneously resolved without any adverse event.  She does have as needed Lopressor which she did not have a chance to take because the episode resolved before she can get it.  Again we talked about vagal maneuvers and maintaining adequate hydration.

## 2020-02-22 NOTE — Assessment & Plan Note (Signed)
She had elevated LFTs in the setting of statin use.  Reluctant to try another statin because of toxicity issues.  She was not interested in injections, but is agreeable to try Nexletol which is the only medication she has not yet tried.

## 2020-02-22 NOTE — Assessment & Plan Note (Signed)
Distant history of inferior STEMI with PCI.  She does have significant hypokinesis noted on echo but preserved EF.  No recurrent anginal symptoms or heart failure symptoms.  She is on overall stable regimen.  Plan:  Continue Toprol and aspirin.  Not currently on statin because of intolerance--we will initiate Nexletol consider referral back to [facility].  With relatively normal blood pressure, has not required ARB.

## 2020-02-22 NOTE — Assessment & Plan Note (Signed)
Doing well with current dose of Toprol.

## 2020-02-22 NOTE — Assessment & Plan Note (Signed)
Stable.  Continue to wear stockings when able and foot elevation.

## 2020-02-22 NOTE — Assessment & Plan Note (Signed)
Distant history of RCA PCI in the setting inferior STEMI with no residual wall motion normality on echo and preserved EF.   Has been on stable regimen with no recurrent angina or heart failure symptoms.

## 2020-02-22 NOTE — Assessment & Plan Note (Signed)
Distant history of PCI.  Remains on maintenance dose aspirin.  No longer on Thienopyridine antiplatelet agent.

## 2020-03-06 ENCOUNTER — Telehealth: Payer: Self-pay | Admitting: *Deleted

## 2020-03-06 ENCOUNTER — Other Ambulatory Visit: Payer: Self-pay | Admitting: *Deleted

## 2020-03-06 MED ORDER — NEXLETOL 180 MG PO TABS: 180.0000 mg | ORAL_TABLET | Freq: Every day | ORAL | 3 refills | Status: DC

## 2020-03-06 NOTE — Telephone Encounter (Signed)
Patient sent a  Message  concerning the refilling of Nexletol 180 mg daily  per patient needs prior authoriazation.    Initial order was not sent to mail order . Patient was suppose to contact office to see if she tolerated medication . Once office receive  the notification will send  New order.   RN sent new order for medication    covermymed started PRIOR AUTHORIAZATION   PA case# [id number] [place]  [id number]

## 2020-03-26 ENCOUNTER — Ambulatory Visit (INDEPENDENT_AMBULATORY_CARE_PROVIDER_SITE_OTHER): Payer: Medicare Other | Admitting: Pharmacist Clinician (PhC)/ Clinical Pharmacy Specialist

## 2020-03-26 ENCOUNTER — Other Ambulatory Visit: Payer: Self-pay

## 2020-03-26 DIAGNOSIS — E785 Hyperlipidemia, unspecified: Secondary | ICD-10-CM

## 2020-03-26 MED ORDER — EZETIMIBE 10 MG PO TABS: 10.0000 mg | ORAL_TABLET | Freq: Every day | ORAL | 6 refills | Status: DC

## 2020-03-26 MED ORDER — PRAVASTATIN SODIUM 10 MG PO TABS: 10.0000 mg | ORAL_TABLET | Freq: Every day | ORAL | 5 refills | Status: DC

## 2020-03-26 NOTE — Assessment & Plan Note (Signed)
Patient with elevated LDL cholesterol, Nexletol denied by insurance due to lack of trial with ezetimibe.  Will have her start ezetimibe 10 mg daily.  In 2 weeks, if she is tolerating this, then she will add on pravastatin 10 mg daily.  If she is able to tolerate both, we will repeat both lipid and liver function labs in 6-8 weeks.  If she has trouble with either medication, we can then appeal to [employer] for her to get Nexletol covered.

## 2020-03-26 NOTE — Patient Instructions (Addendum)
Your Results:             Your most recent labs Goal  Total Cholesterol 166 < 200  Triglycerides 81 < 150  HDL (happy/good cholesterol) 58 > 40  LDL (lousy/bad cholesterol 94 < 70    Medication changes:  Start ezetimibe 10 mg once daily.  After 2 weeks if you are doing well with this we will have you start pravastatin 10 mg once daily.  If have any problems with either of these, please call us right away.  (Desiree Fleming/[name] at [phone]).  At the 2 week mark, please let us know if the ezetimibe is working and I will send in a prescription to [facility]  Lab orders:  Please have labs drawn after 8 weeks - about the first of January   Thank you for choosing [facility]

## 2020-03-26 NOTE — Progress Notes (Signed)
03/26/2020 Ashley Morse May 12, 1951 [id number]   HPI:  Ashley Morse is a 69 y.o. female patient of Dr Herbie Baltimore, who presents today for a lipid clinic evaluation.  See pertinent past medical history below.  She was last seen by Dr. Herbie Baltimore in September at which time he tried to start her on Nexletol.  Chart indicates that patient had been intolerant to statin drugs due to an increase in LFT's, but patient recalls that they caused myalgias. Chart also indicates an allergy to ezetimibe, but patient does not recognize drug name, nor does chart indicate what the allergy is. Her most recent labs were drawn at [facility] by Mady Gemma PA.  When Nexletol was prescribed, the prior authorization was denied, as there was no record of patient having tried and/or failed ezetimibe for at least 4-6 weeks.  She does not want to do any injectable medications, and is willing to re-challenge with other medications to avoid this.    Past Medical History: ASCVD  STEMI, Angioplasty w/stent 2010 (in [place])  Venous insufficiency Bilateral lower extremity edema, venous ablation unsuccessful  hypertension Controlled- on metoprolol succ  hypothyroidism TSH on levothyroxine 88 mcg      Current Medications: none  Cholesterol Goals: LDL < 70   Intolerant/previously tried: simvastatin, rosuvastatin tiw  Social history: no, wine, watches caffeine intake closely  Family history: family history includes Diabetes in her sister; Heart disease in her mother; Lymphoma in her daughter; Stroke in her mother; Varicose Veins in her mother and paternal grandfather; father was alcoholic; youngest daughter diabetic, arthritis  Diet: mostly pescatarian, does eat occasional [place]; good mix of vegetables  Exercise:  Walks 1 mile daily  Labs: 8/21:  TC 166, TG 81, HDL 58, LDL 94   Current Outpatient Medications  Medication Sig Dispense Refill  . ascorbic acid (VITAMIN C) 100 MG tablet     . aspirin EC 81 MG tablet Take  81 mg by mouth daily.    [name] b complex vitamins tablet Take 1 tablet by mouth daily.    . cholecalciferol (VITAMIN D) 25 MCG (1000 UT) tablet Take 1 tablet by mouth daily.     . Coenzyme Q10 300 MG CAPS Take 300 mg by mouth daily. Reported on 07/02/2015    . estradiol (ESTRACE) 0.1 MG/GM vaginal cream Insert 0.5 grams nightly for 2 weeks, then 0.5 grams twice weekly    . latanoprost (XALATAN) 0.005 % ophthalmic solution 1 drop at bedtime.    [name] levothyroxine (SYNTHROID, LEVOTHROID) 88 MCG tablet Take 88 mcg by mouth daily before breakfast.    . Magnesium 400 MG CAPS Take by mouth.    . Melatonin 5 MG CAPS Take 1 tablet by mouth at bedtime.    . metoprolol succinate (TOPROL-XL) 25 MG 24 hr tablet TAKE 1 TABLET DAILY 90 tablet 3  . TURMERIC PO Take 800 mg by mouth daily.    [name] ezetimibe (ZETIA) 10 MG tablet Take 1 tablet (10 mg total) by mouth daily. 30 tablet 6  . pravastatin (PRAVACHOL) 10 MG tablet Take 1 tablet (10 mg total) by mouth daily. 30 tablet 5   No current facility-administered medications for this visit.    Allergies  Allergen Reactions  . Benzalkonium Chloride Other (See Comments)    unknown  . Lactose Other (See Comments)    unknown  . Lactose Intolerance (Gi)   . Lisinopril Other (See Comments)    unknown  . Statins     Atorvastatin,simvastain. Rosuvastatin   .  Sulfa Antibiotics Swelling    Skin discoloration  . Sulfamethoxazole-Trimethoprim     Other reaction(s): Other (See Comments) unknown  . Zetia [Ezetimibe]   . Prednisone Rash    Rash/swelling    Past Medical History:  Diagnosis Date  . Bilateral venous insufficiency    Bilateral lower extremity edema,significant varicose vein; s/p vein stripping and venous abaltionof greater saph. vein  --> proved to be unsuccessful 2/2 vessel size.; Occasionally wears compression stockings during the wintertime.  cannot tolerate during the summertime;   . CAD S/P percutaneous coronary angioplasty May 2010   Distal RPDA: 2  overlapping Promus and DES 2.5 mm x 15 mm, 2.5 mm 23 mm distal to mid RCA. EF 50%. ([place], [place])   . Hemorrhoids   . History of: ST elevation myocardial infarction (STEMI) of inferior wall, subsequent episode of care May 2010   R- PDA occlusion --> PCI with DES  . Hyperlipidemia    on simvastatin stopped due to increase liver enzyme rechecked back to normal  . Hypertension   . Hypothyroidism    On levothyroxine  . Osteoarthritis   . Presence of drug coated stent in right coronary artery May 2010   See above; no longer on DAPT.  [name] Varicose veins     Blood pressure (!) 162/94, pulse 63, resp. rate 16, height 5\' 6"  (1.676 m), weight 158 lb 3.2 oz (71.8 kg), SpO2 95 %.   Hyperlipidemia with target LDL less than 70; intolerant of statin in the past due to elevated LFTs Patient with elevated LDL cholesterol, Nexletol denied by insurance due to lack of trial with ezetimibe.  Will have her start ezetimibe 10 mg daily.  In 2 weeks, if she is tolerating this, then she will add on pravastatin 10 mg daily.  If she is able to tolerate both, we will repeat both lipid and liver function labs in 6-8 weeks.  If she has trouble with either medication, we can then appeal to [employer] for her to get Nexletol covered.     PharmD CPP [facility] [address] [place], [place] [phone]

## 2020-04-15 ENCOUNTER — Telehealth: Payer: Self-pay

## 2020-04-15 MED ORDER — EZETIMIBE 10 MG PO TABS
10.0000 mg | ORAL_TABLET | Freq: Every day | ORAL | 1 refills | Status: DC
Start: 2020-04-15 — End: 2020-09-25

## 2020-04-15 NOTE — Telephone Encounter (Signed)
Pt called and stated that they are tolerating zetia and pravastatin but that they would like to talk w/ a pharmd before proceeding w/an rx. Will route to pharmd pool

## 2020-04-15 NOTE — Telephone Encounter (Signed)
Patient wants to know if she is to continue taking ezetimibe after initiating pravastatin 10mg .  She started taking ezetimibe almost 2 weeks now, added pravastatin on Saturday. Denies any muscle pain, pain or other issues with current therapy.  Recommendation:  Continue both ezetimibe and pravastatin as prescribed.  Refill for ezetimibe 10mg  daily #90 tabs sent to [facility] per patient's request.

## 2020-05-01 ENCOUNTER — Other Ambulatory Visit: Payer: Self-pay | Admitting: Pharmacist

## 2020-05-01 MED ORDER — PRAVASTATIN SODIUM 10 MG PO TABS
10.0000 mg | ORAL_TABLET | Freq: Every day | ORAL | 3 refills | Status: DC
Start: 2020-05-01 — End: 2020-10-08

## 2020-06-25 ENCOUNTER — Telehealth: Payer: Self-pay

## 2020-06-25 NOTE — Telephone Encounter (Signed)
-----   Message from [name] Rodriguez-Guzman, RPH-CPP sent at 06/25/2020  8:26 AM EST ----- Regarding: FW: fasting lipid Please call patient when able ----- Message ----- From: Pearletha Furl, RPH-CPP Sent: 06/24/2020  12:00 AM EST To: [name] Rodriguez-Guzman, RPH-CPP Subject: fasting lipid                                  Call patient. Due to repeat fasting Lipid panel

## 2020-06-25 NOTE — Telephone Encounter (Signed)
Called and spoke w/ pt regarding needing lipid labs and the pt agreed to come when possible

## 2020-06-27 LAB — LIPID PANEL
Chol/HDL Ratio: 2.1 ratio (ref 0.0–4.4)
Cholesterol, Total: 123 mg/dL (ref 100–199)
HDL: 60 mg/dL (ref >39)
LDL Chol Calc (NIH): 50 mg/dL (ref 0–99)
Triglycerides: 60 mg/dL (ref 0–149)
VLDL Cholesterol Cal: 13 mg/dL (ref 5–40)

## 2020-06-27 LAB — COMPREHENSIVE METABOLIC PANEL
ALT: 53 IU/L — ABNORMAL HIGH (ref 0–32)
AST: 67 IU/L — ABNORMAL HIGH (ref 0–40)
Albumin/Globulin Ratio: 1.3 (ref 1.2–2.2)
Albumin: 4.2 g/dL (ref 3.8–4.8)
Alkaline Phosphatase: 67 IU/L (ref 44–121)
BUN/Creatinine Ratio: 18 (ref 12–28)
BUN: 12 mg/dL (ref 8–27)
Bilirubin Total: 0.4 mg/dL (ref 0.0–1.2)
CO2: 26 mmol/L (ref 20–29)
Calcium: 9.4 mg/dL (ref 8.7–10.3)
Chloride: 104 mmol/L (ref 96–106)
Creatinine, Ser: 0.67 mg/dL (ref 0.57–1.00)
GFR calc Af Amer: 103 mL/min/{1.73_m2} (ref >59)
GFR calc non Af Amer: 89 mL/min/{1.73_m2} (ref >59)
Globulin, Total: 3.2 g/dL (ref 1.5–4.5)
Glucose: 84 mg/dL (ref 65–99)
Potassium: 4.3 mmol/L (ref 3.5–5.2)
Sodium: 140 mmol/L (ref 134–144)
Total Protein: 7.4 g/dL (ref 6.0–8.5)

## 2020-07-02 ENCOUNTER — Telehealth: Payer: Self-pay | Admitting: *Deleted

## 2020-07-02 DIAGNOSIS — Z79899 Other long term (current) drug therapy: Secondary | ICD-10-CM

## 2020-07-02 DIAGNOSIS — E785 Hyperlipidemia, unspecified: Secondary | ICD-10-CM

## 2020-07-02 DIAGNOSIS — R7401 Elevation of levels of liver transaminase levels: Secondary | ICD-10-CM

## 2020-07-02 NOTE — Telephone Encounter (Signed)
RN spoke to Dr Herbie Baltimore.  Per verbal order- continue to hold pravastatin until  result are given from repeat  Liver function panel. RN called an inform patient . Patient verbalized understanding

## 2020-07-02 NOTE — Telephone Encounter (Signed)
-----   Message from Marykay Lex, MD sent at 07/01/2020  6:22 PM EST ----- Chemistry panel shows berry normal kidney function and electrolytes.  Liver function tests are just a little bit elevated.  Let us hold pravastatin for a week, then recheck liver function in 2 weeks.  Bryan Lemma, MD

## 2020-07-02 NOTE — Telephone Encounter (Signed)
Spoke to patient .  She just wanted to know if she continue with holding pravastatin until recheck  Liver functions panel in 2 weeks  (ordered). Patient aware will defer to Dr Herbie Baltimore and contact her back

## 2020-07-17 ENCOUNTER — Other Ambulatory Visit: Payer: Self-pay | Admitting: Cardiology

## 2020-07-17 LAB — HEPATIC FUNCTION PANEL
ALT: 42 IU/L — ABNORMAL HIGH (ref 0–32)
AST: 48 IU/L — ABNORMAL HIGH (ref 0–40)
Albumin: 4.2 g/dL (ref 3.8–4.8)
Alkaline Phosphatase: 68 IU/L (ref 44–121)
Bilirubin Total: 0.4 mg/dL (ref 0.0–1.2)
Bilirubin, Direct: 0.12 mg/dL (ref 0.00–0.40)
Total Protein: 7.4 g/dL (ref 6.0–8.5)

## 2020-07-19 ENCOUNTER — Telehealth: Payer: Self-pay | Admitting: *Deleted

## 2020-07-19 DIAGNOSIS — Z79899 Other long term (current) drug therapy: Secondary | ICD-10-CM

## 2020-07-19 DIAGNOSIS — E785 Hyperlipidemia, unspecified: Secondary | ICD-10-CM

## 2020-07-19 DIAGNOSIS — R7401 Elevation of levels of liver transaminase levels: Secondary | ICD-10-CM

## 2020-07-19 NOTE — Telephone Encounter (Signed)
-----   Message from Marykay Lex, MD sent at 07/18/2020  9:48 PM EST ----- AST and ALT have improved compared to 3 weeks ago.  Almost back into the normal range.  Would continue to hold pravastatin for another 2 weeks, then restart.  We should reevaluate liver function in a month after restarting, in order to see if the changes related to being on the statin.  Bryan Lemma, MD

## 2020-07-19 NOTE — Telephone Encounter (Signed)
Called patient , no answer - left detailed message of results and wanting to retest liver function once patient has been taking  Pravastatin for one month. labslip mailed.  any question may call back

## 2020-09-10 LAB — HEPATIC FUNCTION PANEL
ALT: 49 IU/L — ABNORMAL HIGH (ref 0–32)
AST: 63 IU/L — ABNORMAL HIGH (ref 0–40)
Albumin: 4.2 g/dL (ref 3.8–4.8)
Alkaline Phosphatase: 65 IU/L (ref 44–121)
Bilirubin Total: 0.5 mg/dL (ref 0.0–1.2)
Bilirubin, Direct: 0.14 mg/dL (ref 0.00–0.40)
Total Protein: 7.2 g/dL (ref 6.0–8.5)

## 2020-09-11 NOTE — Telephone Encounter (Signed)
Spoke to patient Dr.Harding advised do not take Pravastatin.Advised to schedule Lipid Clinic appointment.Scheduler will call back with appointment.

## 2020-09-25 ENCOUNTER — Other Ambulatory Visit: Payer: Self-pay | Admitting: Cardiology

## 2020-10-08 ENCOUNTER — Other Ambulatory Visit: Payer: Self-pay

## 2020-10-08 ENCOUNTER — Ambulatory Visit (INDEPENDENT_AMBULATORY_CARE_PROVIDER_SITE_OTHER): Payer: Medicare Other | Admitting: Pharmacist

## 2020-10-08 VITALS — BP 146/102 | HR 66 | Resp 16 | Ht 65.0 in | Wt 162.4 lb

## 2020-10-08 DIAGNOSIS — I8393 Asymptomatic varicose veins of bilateral lower extremities: Secondary | ICD-10-CM | POA: Insufficient documentation

## 2020-10-08 DIAGNOSIS — E785 Hyperlipidemia, unspecified: Secondary | ICD-10-CM | POA: Diagnosis not present

## 2020-10-08 DIAGNOSIS — I2510 Atherosclerotic heart disease of native coronary artery without angina pectoris: Secondary | ICD-10-CM

## 2020-10-08 NOTE — Patient Instructions (Addendum)
It was nice meeting you!  We would like your LDL (bad cholesterol) to be less than 70  Please continue your ezetimibe 10mg  once daily  We will start you on a new medication called Repatha which you will inject once every 2 weeks  We will recheck your lipid panel in about 2-3 months  We will call you when the prior authorization is approved  Please call with any questions!  , PharmD, BCACP, CDCES, CPP [facility] [address], [place], [place] Phone: [phone]; Fax: [phone] 10/08/2020 2:32 PM

## 2020-10-08 NOTE — Progress Notes (Signed)
Patient ID: Ashley Morse                 DOB: 07-16-1950                    MRN: [id number]     HPI: Ashley Morse is a 70 y.o. female patient referred to lipid clinic by Dr Herbie Baltimore. PMH is significant for MI, CAD, PSVT, HTN and hypothyroidism.  Patient has an history of statin intolerance with rosuvastatin and atorvastatin.  Was able to tolerate pravastatin but patient had increase in LFTs and as instructed to discontinue.  Patient presents today in good spirits.  Follows a pescatarian diet and avoids sweets.  Drinks a small amount of wine.  Avoids processed foods.  Currently only managed on Zetia 10mg  which she tolerates well.  Previously was prescribed Nexletol but was denied by prescription insurance.    Of note, patient has been taking Red Yeast Rice OTC.  Is very physically active.   Uses her smart watch to measure her daily steps and will not go to bed at night until she reaches her goal.   Current Medications: Zetia 10mg ,  Intolerances: pravastatin (elevated LFTs) Risk Factors: Hx of MI, CAD, HTN LDL goal: <70  Labs: TC 123, Trigs 60, HDL 60, LDL 50 (06/27/20 on Zetia 10 and pravastatin 10)  Past Medical History:  Diagnosis Date  . Bilateral venous insufficiency    Bilateral lower extremity edema,significant varicose vein; s/p vein stripping and venous abaltionof greater saph. vein  --> proved to be unsuccessful 2/2 vessel size.; Occasionally wears compression stockings during the wintertime.  cannot tolerate during the summertime;   . CAD S/P percutaneous coronary angioplasty May 2010   Distal RPDA: 2 overlapping Promus and DES 2.5 mm x 15 mm, 2.5 mm 23 mm distal to mid RCA. EF 50%. ([place]25/[place])   . Hemorrhoids   . History of: ST elevation myocardial infarction (STEMI) of inferior wall, subsequent episode of care May 2010   R- PDA occlusion --> PCI with DES  . Hyperlipidemia    on simvastatin stopped due to increase liver enzyme rechecked back to normal  .  Hypertension   . Hypothyroidism    On levothyroxine  . Osteoarthritis   . Presence of drug coated stent in right coronary artery May 2010   See above; no longer on DAPT.  June 2010 Varicose veins     Current Outpatient Medications on File Prior to Visit  Medication Sig Dispense Refill  . ascorbic acid (VITAMIN C) 100 MG tablet     . aspirin EC 81 MG tablet Take 81 mg by mouth daily.    June 2010 b complex vitamins tablet Take 1 tablet by mouth daily.    . cholecalciferol (VITAMIN D) 25 MCG (1000 UT) tablet Take 1 tablet by mouth daily.     . Coenzyme Q10 300 MG CAPS Take 300 mg by mouth daily. Reported on 07/02/2015    . estradiol (ESTRACE) 0.1 MG/GM vaginal cream Insert 0.5 grams nightly for 2 weeks, then 0.5 grams twice weekly    . ezetimibe (ZETIA) 10 MG tablet TAKE 1 TABLET DAILY 90 tablet 2  . latanoprost (XALATAN) 0.005 % ophthalmic solution 1 drop at bedtime.    [name] levothyroxine (SYNTHROID, LEVOTHROID) 88 MCG tablet Take 88 mcg by mouth daily before breakfast.    . Magnesium 400 MG CAPS Take by mouth.    . Melatonin 5 MG CAPS Take 1 tablet by mouth at bedtime.    07/04/2015  metoprolol succinate (TOPROL-XL) 25 MG 24 hr tablet TAKE 1 TABLET DAILY 90 tablet 3  . pravastatin (PRAVACHOL) 10 MG tablet Take 1 tablet (10 mg total) by mouth daily. 90 tablet 3  . TURMERIC PO Take 800 mg by mouth daily.     No current facility-administered medications on file prior to visit.    Allergies  Allergen Reactions  . Benzalkonium Chloride Other (See Comments)    unknown  . Lactose Other (See Comments)    unknown  . Lactose Intolerance (Gi)   . Lisinopril Other (See Comments)    unknown  . Statins     Atorvastatin,simvastain. Rosuvastatin   . Sulfa Antibiotics Swelling    Skin discoloration  . Sulfamethoxazole-Trimethoprim     Other reaction(s): Other (See Comments) unknown  . Zetia [Ezetimibe]   . Prednisone Rash    Rash/swelling    Assessment/Plan:  1. Hyperlipidemia - Patient last LDL 50 which is at  goal of <70.  However, this was on pravastatin and zetia.  Pravastatin has since been discontinued due to elevated LFTs. Unknown what current LDL is.  Discovered patient has been taking Red Yeast Rice.  Advised to discontinue in case this was another cause of LFT elevation.  Patient will need further lipid lowering therapy since LDL will inevitably go up.  Due to history of MI recommended [id number].  Using demo pen, educated patient on mechanism of action, storage, site selection and administration.  Patient was able to demonstrate in room.  Will complete PA.  Lipid panel scheduled for July.   Continue Zetia 10mg  daily Start Repatha 140mg  SQ q 14d Recheck lipid panel in 2-3 months  , PharmD, BCACP, CDCES, CPP [facility] [address] [place], [address] [place] Phone: [phone]; Fax: [phone] 10/08/2020 3:48 PM

## 2020-10-16 ENCOUNTER — Telehealth: Payer: Self-pay

## 2020-10-16 DIAGNOSIS — I2510 Atherosclerotic heart disease of native coronary artery without angina pectoris: Secondary | ICD-10-CM

## 2020-10-16 DIAGNOSIS — R7401 Elevation of levels of liver transaminase levels: Secondary | ICD-10-CM

## 2020-10-16 DIAGNOSIS — E785 Hyperlipidemia, unspecified: Secondary | ICD-10-CM

## 2020-10-16 DIAGNOSIS — Z79899 Other long term (current) drug therapy: Secondary | ICD-10-CM

## 2020-10-16 MED ORDER — REPATHA SURECLICK 140 MG/ML ~~LOC~~ SOAJ: 140.0000 mg | SUBCUTANEOUS | 11 refills | Status: DC

## 2020-10-16 MED ORDER — REPATHA SURECLICK 140 MG/ML ~~LOC~~ SOAJ
140.0000 mg | SUBCUTANEOUS | 11 refills | Status: DC
Start: 2020-10-16 — End: 2020-10-28

## 2020-10-16 NOTE — Addendum Note (Signed)
Addended by: Eather Colas on: 10/16/2020 09:46 AM   Modules accepted: Orders

## 2020-10-16 NOTE — Telephone Encounter (Signed)
Called and spoke w/pt and stated that they were approved for the repatha, rx sent, pt instructed to complete fasting labs post 4th dose and pt voiced understanding.

## 2020-10-16 NOTE — Telephone Encounter (Signed)
Pt called and stated that they would rather have [facility] when they return from vacation so I called and cancelled the rx at [facility] and will await a call from pt when she returns

## 2020-10-28 MED ORDER — REPATHA SURECLICK 140 MG/ML ~~LOC~~ SOAJ
140.0000 mg | SUBCUTANEOUS | 3 refills | Status: DC
Start: 2020-10-28 — End: 2021-10-06

## 2020-10-28 NOTE — Telephone Encounter (Signed)
Pt returned a call and stated that they will like rx for repatha [facility] so that's where I sent it and explained to her we need labs fasting post 4th dose and the pt voiced understanding

## 2020-10-28 NOTE — Addendum Note (Signed)
Addended by: Eather Colas on: 10/28/2020 09:05 AM   Modules accepted: Orders

## 2021-01-06 ENCOUNTER — Other Ambulatory Visit: Payer: Self-pay | Admitting: Cardiology

## 2021-01-06 DIAGNOSIS — I471 Supraventricular tachycardia: Secondary | ICD-10-CM

## 2021-01-21 DIAGNOSIS — E785 Hyperlipidemia, unspecified: Secondary | ICD-10-CM

## 2021-01-21 DIAGNOSIS — R7401 Elevation of levels of liver transaminase levels: Secondary | ICD-10-CM

## 2021-01-30 LAB — LIPID PANEL
Chol/HDL Ratio: 1.5 ratio (ref 0.0–4.4)
Cholesterol, Total: 99 mg/dL — ABNORMAL LOW (ref 100–199)
HDL: 67 mg/dL (ref >39)
LDL Chol Calc (NIH): 20 mg/dL (ref 0–99)
Triglycerides: 43 mg/dL (ref 0–149)
VLDL Cholesterol Cal: 12 mg/dL (ref 5–40)

## 2021-01-30 LAB — HEPATIC FUNCTION PANEL
ALT: 47 IU/L — ABNORMAL HIGH (ref 0–32)
AST: 54 IU/L — ABNORMAL HIGH (ref 0–40)
Albumin: 4.4 g/dL (ref 3.7–4.7)
Alkaline Phosphatase: 68 IU/L (ref 44–121)
Bilirubin Total: 0.4 mg/dL (ref 0.0–1.2)
Bilirubin, Direct: 0.17 mg/dL (ref 0.00–0.40)
Total Protein: 7.4 g/dL (ref 6.0–8.5)

## 2021-02-06 NOTE — Addendum Note (Signed)
Addended by: Cheree Ditto on: 02/06/2021 05:59 PM   Modules accepted: Orders

## 2021-02-15 DIAGNOSIS — U071 COVID-19: Secondary | ICD-10-CM

## 2021-02-15 HISTORY — DX: COVID-19: U07.1
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
## 2021-02-18 ENCOUNTER — Ambulatory Visit (INDEPENDENT_AMBULATORY_CARE_PROVIDER_SITE_OTHER): Payer: Medicare Other | Admitting: Obstetrics and Gynecology

## 2021-02-18 ENCOUNTER — Encounter: Payer: Self-pay | Admitting: Obstetrics and Gynecology

## 2021-02-18 ENCOUNTER — Other Ambulatory Visit: Payer: Self-pay

## 2021-02-18 VITALS — BP 157/90 | HR 78 | Ht 65.0 in | Wt 156.0 lb

## 2021-02-18 DIAGNOSIS — N993 Prolapse of vaginal vault after hysterectomy: Secondary | ICD-10-CM | POA: Diagnosis not present

## 2021-02-18 DIAGNOSIS — N8110 Cystocele, unspecified: Secondary | ICD-10-CM | POA: Diagnosis not present

## 2021-02-18 DIAGNOSIS — N393 Stress incontinence (female) (male): Secondary | ICD-10-CM | POA: Diagnosis not present

## 2021-03-19 ENCOUNTER — Ambulatory Visit (INDEPENDENT_AMBULATORY_CARE_PROVIDER_SITE_OTHER): Payer: Medicare Other | Admitting: Obstetrics and Gynecology

## 2021-03-19 ENCOUNTER — Encounter: Payer: Self-pay | Admitting: Obstetrics and Gynecology

## 2021-03-19 ENCOUNTER — Other Ambulatory Visit: Payer: Self-pay

## 2021-03-19 VITALS — BP 168/91 | HR 69 | Ht 64.0 in | Wt 156.0 lb

## 2021-03-19 DIAGNOSIS — N993 Prolapse of vaginal vault after hysterectomy: Secondary | ICD-10-CM

## 2021-03-19 DIAGNOSIS — N393 Stress incontinence (female) (male): Secondary | ICD-10-CM

## 2021-03-19 DIAGNOSIS — N8110 Cystocele, unspecified: Secondary | ICD-10-CM | POA: Diagnosis not present

## 2021-03-19 NOTE — Progress Notes (Signed)
[facility] Return Visit  SUBJECTIVE  History of Present Illness: Ashley Morse is a 70 y.o. female with Stage II POP presenting for a simple CMG testing. She is interested in pursuing surgery.     Past Medical History: Patient  has a past medical history of Bilateral venous insufficiency, CAD S/P percutaneous coronary angioplasty (May 2010), Hemorrhoids, History of: ST elevation myocardial infarction (STEMI) of inferior wall, subsequent episode of care (May 2010), Hyperlipidemia, Hypertension, Hypothyroidism, Osteoarthritis, Presence of drug coated stent in right coronary artery (May 2010), and Varicose veins.   Past Surgical History: She  has a past surgical history that includes Cardiac catheterization (10/23/2008); Coronary angioplasty with stent (10/23/2008); left venous  duplex (Left, 08/31/2011); Varicose vein surgery (Right); Radiofrequency Ablation - Left Greater Saphenous Vein: Unsuccessful (Left, 08/31/2011); transthoracic echocardiogram (09/15/2008); Endovenous ablation saphenous vein w/ laser (Left, 08/01/2015); Endovenous ablation saphenous vein w/ laser (Right, 08/15/2015); Cystocele repair; and Abdominal hysterectomy.   Medications: She has a current medication list which includes the following prescription(s): ascorbic acid, aspirin ec, b complex vitamins, cholecalciferol, diphenhydramine, estradiol, repatha sureclick, ezetimibe, latanoprost, levothyroxine, magnesium, metoprolol succinate, naproxen sodium, and turmeric.   Allergies: Patient is allergic to benzalkonium chloride, lactose, lactose intolerance (gi), lisinopril, pravastatin, statins, sulfa antibiotics, sulfamethoxazole-trimethoprim, zetia [ezetimibe], and prednisone.   Social History: Patient  reports that she has never smoked. She has never used smokeless tobacco. She reports current alcohol use of about 1.0 standard drink per week. She reports that she does not use drugs.      OBJECTIVE      Physical Exam: Vitals:   03/19/21 1120  BP: (!) 168/91  Pulse: 69  Weight: 156 lb (70.8 kg)  Height: 5\' 4"  (1.626 m)   Gen: No apparent distress, A&O x 3.  Detailed Urogynecologic Evaluation:  Deferred. Prior exam showed:  POP-Q (02/18/21):    POP-Q   3                                            Aa   3                                           Ba   -2.5                                              C    5.5                                            Gh   4                                            Pb   9  tvl    -3                                            Ap   -3                                            Bp                                                  D        Verbal consent was obtained to perform simple CMG procedure:   Prolapse was reduced using 2 large cotton swabs. Urethra was prepped with betadine and a 34F catheter was placed and bladder was drained completely. The bladder was then backfilled with sterile water by gravity.  First sensation: 23ml First Desire: Strong Desire: Capacity: Cough stress test was positive in the standing position. Valsalva stress test was negative.  Catheter was placed to empty the bladder.   Interpretation: CMG showed normal sensation, and normal cystometric capacity. Findings positive for stress incontinence, negative for detrusor overactivity.     ASSESSMENT AND PLAN    Ms. Gerken is a 70 y.o. with:  1. Vaginal vault prolapse after hysterectomy   2. Prolapse of anterior vaginal wall     Plan for surgery: Exam under anesthesia, robotic sacrocolpopexy, midurethral sling and cystoscopy  - We reviewed the patient's specific anatomic and functional findings, with the assistance of diagrams, and together finalized the above procedure. The planned surgical procedures were discussed along with the surgical risks outlined below, which were also provided on  a detailed handout. Additional treatment options including expectant management, conservative management, medical management were discussed where appropriate.  We reviewed the benefits and risks of each treatment option.   General Surgical Risks: For all procedures, there are risks of bleeding, infection, damage to surrounding organs including but not limited to bowel, bladder, blood vessels, ureters and nerves, and need for further surgery if an injury were to occur. These risks are all low with minimally invasive surgery.    - For preop Visit:  She is required to have a visit within 30 days of her surgery.   - Medical clearance: required Letter sent to Dr Herbie Baltimore- Cardiologist requesting risk stratification and medical optimization. - Anticoagulant use: No- baby Jonne Ply only - Medicaid Hysterectomy form: No - Accepts blood transfusion: will confirm at pre op visit - Expected length of stay: outpatient  Request sent for surgery scheduling.   [name], MD  Time spent: I spent 20 minutes dedicated to the care of this patient on the date of this encounter to include pre-visit review of records, face-to-face time with the patient discussing surgery and post visit documentation. Additional time was spent for the CMG procedure.

## 2021-03-24 ENCOUNTER — Telehealth: Payer: Self-pay

## 2021-03-24 NOTE — Telephone Encounter (Signed)
   [facility] Pre-operative Risk Assessment    Patient Name: Ashley Morse  DOB: April 03, 1951 MRN: [id number]  Request for surgical clearance:  What type of surgery is being performed?  robotic sacrocolpopexy, midurethral sling and cystoscopy (3.5 HOURS)  When is this surgery scheduled? TBD  What type of clearance is required (medical clearance vs. Pharmacy clearance to hold med vs. Both)? MEDICAL  Are there any medications that need to be held prior to surgery and how long? NONE LISTED  Practice name and name of physician performing surgery? U[facility]gynecology at [facility] Sherlene Shams, MD    What is the office phone number? [phone]   7.   What is the office fax number? [phone]  8.   Anesthesia type (None, local, MAC, general) ? GENERAL

## 2021-03-26 NOTE — Telephone Encounter (Signed)
   Name: Ashley Morse  DOB: 19-Jan-1951  MRN: [id number]  Primary Cardiologist: Bryan Lemma, MD  Chart reviewed as part of pre-operative protocol coverage. Patient has been seen in our PharmD lipid clinic several times over the last year but has not been seen by a MD/APP in over 1 year (since 01/2020). Therefore, she will require a follow-up visit in order to better assess preoperative cardiovascular risk.  Pre-op covering staff: - Please schedule appointment and call patient to inform them. If patient already had an upcoming appointment within acceptable timeframe, please add "pre-op clearance" to the appointment notes so provider is aware. - Please contact requesting surgeon's office via preferred method (i.e, phone, fax) to inform them of need for appointment prior to surgery.  I will remove from pre-op pool.   Corrin Parker, PA-C  03/26/2021, 9:37 AM

## 2021-03-26 NOTE — Telephone Encounter (Signed)
Pt has been scheduled to see Joni Reining, DNP 04/04/21 @  10:55 for pre op clearance. I will forward notes to DNP for upcoming appt. Will send FYI to surgeon's office pt has appt 04/04/21

## 2021-04-02 ENCOUNTER — Other Ambulatory Visit: Payer: Self-pay

## 2021-04-02 DIAGNOSIS — R7401 Elevation of levels of liver transaminase levels: Secondary | ICD-10-CM

## 2021-04-02 DIAGNOSIS — E785 Hyperlipidemia, unspecified: Secondary | ICD-10-CM

## 2021-04-03 LAB — HEPATIC FUNCTION PANEL
ALT: 48 IU/L — ABNORMAL HIGH (ref 0–32)
AST: 64 IU/L — ABNORMAL HIGH (ref 0–40)
Albumin: 4.2 g/dL (ref 3.7–4.7)
Alkaline Phosphatase: 74 IU/L (ref 44–121)
Bilirubin Total: 0.4 mg/dL (ref 0.0–1.2)
Bilirubin, Direct: 0.14 mg/dL (ref 0.00–0.40)
Total Protein: 7.4 g/dL (ref 6.0–8.5)

## 2021-04-03 LAB — LIPID PANEL
Chol/HDL Ratio: 1.6 ratio (ref 0.0–4.4)
Cholesterol, Total: 105 mg/dL (ref 100–199)
HDL: 64 mg/dL (ref >39)
LDL Chol Calc (NIH): 28 mg/dL (ref 0–99)
Triglycerides: 56 mg/dL (ref 0–149)
VLDL Cholesterol Cal: 13 mg/dL (ref 5–40)

## 2021-04-03 NOTE — Progress Notes (Signed)
Cardiology Office Note   Date:  04/03/2021   ID:  [name], DOB 03/19/1951, MRN [id number]  PCP:  Richmond Campbell., PA-C  Cardiologist: Dr. Herbie Baltimore CC: Pre-operative evaluation     History of Present Illness: Ashley Morse is a 70 y.o. female who presents for preoperative evaluation for robotic sacral plexi, mid urethral sling and cystoscopy on date to be determined, via urogynecology [facility], Dr. Feliz Beam.  Mrs. Vargus has a past medical history of coronary artery disease with PCI and significant bilateral venous reflux swelling (status post multiple ablations followed by CVRR), and hyperlipidemia.  Was last seen by Dr. Herbie Baltimore on 02/14/2020 at which time she was doing well with good blood pressure control, no overt symptoms of angina or dyspnea, she was to continue Toprol and aspirin.  Due to intolerance of statin therapy she was started on Nexletol.  Blood pressure was well controlled.    She was to continue to wear support stockings and keep her feet elevated and follow-up with CVRR.  She had no complaints of rapid heart rhythm since the summer which was felt to be exacerbated by heat and dehydration.  She was to continue as needed Lopressor.  She continues on Repatha therapy and has labs monitored by Pharmacists.  She denies chest pain or palpitations. She is medically compliant.   Past Medical History:  Diagnosis Date   Bilateral venous insufficiency    Bilateral lower extremity edema,significant varicose vein; s/p vein stripping and venous abaltionof greater saph. vein  --> proved to be unsuccessful 2/2 vessel size.; Occasionally wears compression stockings during the wintertime.  cannot tolerate during the summertime;    CAD S/P percutaneous coronary angioplasty May 2010   Distal RPDA: 2 overlapping Promus and DES 2.5 mm x 15 mm, 2.5 mm 23 mm distal to mid RCA. EF 50%. [place] Big, [place])    Hemorrhoids    History of: ST elevation  myocardial infarction (STEMI) of inferior wall, subsequent episode of care May 2010   R- PDA occlusion --> PCI with DES   Hyperlipidemia    on simvastatin stopped due to increase liver enzyme rechecked back to normal   Hypertension    Hypothyroidism    On levothyroxine   Osteoarthritis    Presence of drug coated stent in right coronary artery May 2010   See above; no longer on DAPT.   Varicose veins     Past Surgical History:  Procedure Laterality Date   ABDOMINAL HYSTERECTOMY     CARDIAC CATHETERIZATION  10/23/2008   in [place]- for inferior STEMI, occluded RPDA   CORONARY ANGIOPLASTY WITH STENT PLACEMENT  10/23/2008   occulsion of RPDA  -- 2 Promus DES stent overlapping ;2.5 x 15 and 2.5 x 23 mm stents from distal RCA to PDA, ef50%   CYSTOCELE REPAIR     ENDOVENOUS ABLATION SAPHENOUS VEIN W/ LASER Left 08/01/2015   endovenous laser ablation left greater saphenous vein by Gretta Began MD   ENDOVENOUS ABLATION SAPHENOUS VEIN W/ LASER Right 08/15/2015   endovenous laser ablation right greater saphenous vein by Gretta Began MD   left venous  duplex Left 08/31/2011   Negative for DVT   Radiofrequency Ablation - Left Greater Saphenous Vein: Unsuccessful Left 08/31/2011   TRANSTHORACIC ECHOCARDIOGRAM  09/15/2008    Echo, May 2010 - following PCI for inferior STEMI. No wall motion abnormalities, EF of 60%   VARICOSE VEIN SURGERY Right      Current Outpatient Medications  Medication Sig Dispense Refill   ascorbic acid (VITAMIN C) 100 MG tablet      aspirin EC 81 MG tablet Take 81 mg by mouth daily.     b complex vitamins tablet Take 1 tablet by mouth daily.     cholecalciferol (VITAMIN D) 25 MCG (1000 UT) tablet Take 1 tablet by mouth daily.      diphenhydrAMINE (BENADRYL) 25 mg capsule Take 25 mg by mouth every 6 (six) hours as needed. PRN     estradiol (ESTRACE) 0.1 MG/GM vaginal cream Insert 0.5 grams nightly for 2 weeks, then 0.5 grams twice weekly     Evolocumab (REPATHA  SURECLICK) 140 MG/ML SOAJ Inject 140 mg into the skin every 14 (fourteen) days. 6 mL 3   ezetimibe (ZETIA) 10 MG tablet TAKE 1 TABLET DAILY 90 tablet 2   latanoprost (XALATAN) 0.005 % ophthalmic solution 1 drop at bedtime.     levothyroxine (SYNTHROID, LEVOTHROID) 88 MCG tablet Take 88 mcg by mouth daily before breakfast.     Magnesium 400 MG CAPS Take by mouth.     metoprolol succinate (TOPROL-XL) 25 MG 24 hr tablet TAKE 1 TABLET DAILY 90 tablet 3   naproxen sodium (ALEVE) 220 MG tablet Take 220 mg by mouth. PRN     TURMERIC PO Take 800 mg by mouth daily.     No current facility-administered medications for this visit.    Allergies:   Benzalkonium chloride, Lactose, Lactose intolerance (gi), Lisinopril, Pravastatin, Statins, Sulfa antibiotics, Sulfamethoxazole-trimethoprim, Zetia [ezetimibe], and Prednisone    Social History:  The patient  reports that she has never smoked. She has never used smokeless tobacco. She reports current alcohol use of about 1.0 standard drink per week. She reports that she does not use drugs.   Family History:  The patient's family history includes Diabetes in her sister; Heart disease in her mother; Lymphoma in her daughter; Stroke in her mother; Varicose Veins in her mother and paternal grandfather.    ROS: All other systems are reviewed and negative. Unless otherwise mentioned in H&P    PHYSICAL EXAM: VS:  There were no vitals taken for this visit. , BMI There is no height or weight on file to calculate BMI. GEN: Well nourished, well developed, in no acute distress HEENT: normal Neck: no JVD, carotid bruits, or masses Cardiac: RRR; very soft systolic murmurs, rubs, or gallops,no edema  Respiratory:  Clear to auscultation bilaterally, normal work of breathing GI: soft, nontender, nondistended, + BS MS: no deformity or atrophy Skin: warm and dry, no rash Neuro:  Strength and sensation are intact Psych: euthymic mood, full affect   EKG:  EKG is  ordered today. The ekg ordered today demonstrates personally reviewed-normal sinus rhythm with PAC, heart rate of 61 bpm.   Recent Labs: 06/27/2020: BUN 12; Creatinine, Ser 0.67; Potassium 4.3; Sodium 140 04/02/2021: ALT 48    Lipid Panel    Component Value Date/Time   CHOL 105 04/02/2021 1442   TRIG 56 04/02/2021 1442   HDL 64 04/02/2021 1442   CHOLHDL 1.6 04/02/2021 1442   LDLCALC 28 04/02/2021 1442      Wt Readings from Last 3 Encounters:  03/19/21 156 lb (70.8 kg)  02/18/21 156 lb (70.8 kg)  10/08/20 162 lb 6.4 oz (73.7 kg)      Other studies Reviewed: Carotid Doppler Study 04/29/2017 Right Carotid: There is evidence in the right ICA of a 1-39% stenosis.   Left Carotid: There is no evidence of stenosis in  the left ICA.   Vertebrals:  Both vertebral arteries were patent with antegrade flow.  Subclavians: Normal flow hemodynamics were seen in bilateral subclavian               arteries.   ASSESSMENT AND PLAN:  1.  Preoperative cardiac evaluation: Chart reviewed as part of pre-operative protocol coverage. Given past medical history and time since last visit, based on ACC/AHA guidelines, Xee Hollman would be at acceptable risk for the planned procedure without further cardiovascular testing.  May hold aspirin 5 days preoperatively at the discretion of surgeon but begin as soon as possible thereafter.   2.  Hypertension: Currently well controlled.  She does notice elevations of blood pressure while in clinic offices but normalization when she is at home.  I have advised her that this is okay, unless blood pressures at home become more more elevated, are we are seeing significant elevations in clinical blood pressures in the office.  At this time the changes in her regimen.  Reassurance is given.  3.  Coronary artery disease: History of PCI with 2 overlapping DES while living in [place].  She remains on secondary prevention.  Continue aspirin, PCSK9  inhibition and Zetia therapy, review of labs reveals elevated AST and ALT although stable.  Will defer to pharmacy as they are managing labs and lipid management and dosing.  Current medicines are reviewed at length with the patient today.  I have spent 25 mins dedicated to the care of this patient on the date of this encounter to include pre-visit review of records, assessment, management and diagnostic testing,with shared decision making.  Labs/ tests ordered today include: None  Bettey Mare. Liborio Nixon, ANP, AACC   04/03/2021 12:25 PM    [facility] [address] Office [phone] Fax [phone]  Notice: This dictation was prepared with Dragon dictation along with smaller phrase technology. Any transcriptional errors that result from this process are unintentional and may not be corrected upon review.

## 2021-04-04 ENCOUNTER — Encounter: Payer: Self-pay | Admitting: Adult Health

## 2021-04-04 ENCOUNTER — Ambulatory Visit (INDEPENDENT_AMBULATORY_CARE_PROVIDER_SITE_OTHER): Payer: Medicare Other | Admitting: Adult Health

## 2021-04-04 ENCOUNTER — Other Ambulatory Visit: Payer: Self-pay

## 2021-04-04 VITALS — BP 136/82 | HR 61 | Ht 64.0 in | Wt 160.0 lb

## 2021-04-04 DIAGNOSIS — I471 Supraventricular tachycardia: Secondary | ICD-10-CM

## 2021-04-04 DIAGNOSIS — Z01810 Encounter for preprocedural cardiovascular examination: Secondary | ICD-10-CM | POA: Diagnosis not present

## 2021-04-04 DIAGNOSIS — Z01818 Encounter for other preprocedural examination: Secondary | ICD-10-CM

## 2021-04-04 DIAGNOSIS — I10 Essential (primary) hypertension: Secondary | ICD-10-CM

## 2021-04-04 DIAGNOSIS — E7800 Pure hypercholesterolemia, unspecified: Secondary | ICD-10-CM

## 2021-04-04 DIAGNOSIS — I2510 Atherosclerotic heart disease of native coronary artery without angina pectoris: Secondary | ICD-10-CM | POA: Diagnosis not present

## 2021-04-04 NOTE — Patient Instructions (Addendum)
Medication Instructions:  No changes  *If you need a refill on your cardiac medications before your next appointment, please call your pharmacy*   Lab Work: Not needed    Testing/Procedures:  Not needed  Follow-Up: At [facility], you and your health needs are our priority.  As part of our continuing mission to provide you with exceptional heart care, we have created designated Provider Care Teams.  These Care Teams include your primary Cardiologist (physician) and Advanced Practice Providers (APPs -  Physician Assistants and Nurse Practitioners) who all work together to provide you with the care you need, when you need it.     Your next appointment:   6 month(s)  The format for your next appointment:   In Person  Provider:   Bryan Lemma, MD    Other Instructions  Clearance for surgery

## 2021-05-07 ENCOUNTER — Ambulatory Visit (INDEPENDENT_AMBULATORY_CARE_PROVIDER_SITE_OTHER): Payer: Medicare Other | Admitting: Obstetrics and Gynecology

## 2021-05-07 ENCOUNTER — Encounter: Payer: Self-pay | Admitting: Obstetrics and Gynecology

## 2021-05-07 ENCOUNTER — Other Ambulatory Visit: Payer: Self-pay

## 2021-05-07 DIAGNOSIS — N993 Prolapse of vaginal vault after hysterectomy: Secondary | ICD-10-CM

## 2021-05-07 DIAGNOSIS — N393 Stress incontinence (female) (male): Secondary | ICD-10-CM

## 2021-05-07 DIAGNOSIS — N8110 Cystocele, unspecified: Secondary | ICD-10-CM

## 2021-05-07 MED ORDER — ACETAMINOPHEN 500 MG PO TABS: 500.0000 mg | ORAL_TABLET | Freq: Four times a day (QID) | ORAL | 0 refills | Status: DC | PRN

## 2021-05-07 MED ORDER — TRAMADOL HCL 50 MG PO TABS: 50.0000 mg | ORAL_TABLET | Freq: Three times a day (TID) | ORAL | 0 refills | Status: DC | PRN

## 2021-05-07 MED ORDER — POLYETHYLENE GLYCOL 3350 17 GM/SCOOP PO POWD
17.0000 g | Freq: Every day | ORAL | 0 refills | Status: DC
Start: 2021-05-07 — End: 2021-05-09

## 2021-05-07 MED ORDER — IBUPROFEN 600 MG PO TABS: 600.0000 mg | ORAL_TABLET | Freq: Four times a day (QID) | ORAL | 0 refills | Status: DC | PRN

## 2021-05-07 NOTE — Progress Notes (Signed)
Urogynecology Pre-Operative visit  Subjective Chief Complaint: Ashley Morse presents for a preoperative encounter.   History of Present Illness: Ashley Morse is a 70 y.o. female who presents for preoperative visit.  She is scheduled to undergo Exam under anesthesia, robotic sacrocolpopexy, midurethral sling and cystoscopy, possible sacrospinous fixation and anterior repair if robotic surgery not possible  on 05/27/20.  Her symptoms include vaginal bulge and incontinence, and she was was found to have Stage III anterior, Stage I posterior, Stage I apical prolapse .   Simple CMG showed: CMG showed normal sensation, and normal cystometric capacity. Findings positive for stress incontinence, negative for detrusor overactivity.   Past Medical History:  Diagnosis Date   Bilateral venous insufficiency    Bilateral lower extremity edema,significant varicose vein; s/p vein stripping and venous abaltionof greater saph. vein  --> proved to be unsuccessful 2/2 vessel size.; Occasionally wears compression stockings during the wintertime.  cannot tolerate during the summertime;    CAD S/P percutaneous coronary angioplasty May 2010   Distal RPDA: 2 overlapping Promus and DES 2.5 mm x 15 mm, 2.5 mm 23 mm distal to mid RCA. EF 50%. [place] Big, [place])    Hemorrhoids    History of: ST elevation myocardial infarction (STEMI) of inferior wall, subsequent episode of care May 2010   R- PDA occlusion --> PCI with DES   Hyperlipidemia    on simvastatin stopped due to increase liver enzyme rechecked back to normal   Hypertension    Hypothyroidism    On levothyroxine   Osteoarthritis    Presence of drug coated stent in right coronary artery May 2010   See above; no longer on DAPT.   Varicose veins      Past Surgical History:  Procedure Laterality Date   ABDOMINAL HYSTERECTOMY     CARDIAC CATHETERIZATION  10/23/2008   in [place]- for inferior STEMI, occluded RPDA   CORONARY  ANGIOPLASTY WITH STENT PLACEMENT  10/23/2008   occulsion of RPDA  -- 2 Promus DES stent overlapping ;2.5 x 15 and 2.5 x 23 mm stents from distal RCA to PDA, ef50%   CYSTOCELE REPAIR     ENDOVENOUS ABLATION SAPHENOUS VEIN W/ LASER Left 08/01/2015   endovenous laser ablation left greater saphenous vein by Gretta Began MD   ENDOVENOUS ABLATION SAPHENOUS VEIN W/ LASER Right 08/15/2015   endovenous laser ablation right greater saphenous vein by Gretta Began MD   left venous  duplex Left 08/31/2011   Negative for DVT   Radiofrequency Ablation - Left Greater Saphenous Vein: Unsuccessful Left 08/31/2011   TRANSTHORACIC ECHOCARDIOGRAM  09/15/2008    Echo, May 2010 - following PCI for inferior STEMI. No wall motion abnormalities, EF of 60%   VARICOSE VEIN SURGERY Right     is allergic to benzalkonium chloride, lactose, lactose intolerance (gi), lisinopril, pravastatin, statins, sulfa antibiotics, sulfamethoxazole-trimethoprim, zetia [ezetimibe], and prednisone.   Family History  Problem Relation Age of Onset   Stroke Mother    Heart disease Mother    Varicose Veins Mother    Diabetes Sister    Lymphoma Daughter        Lymphoma   Varicose Veins Paternal Grandfather     Social History   Tobacco Use   Smoking status: Never   Smokeless tobacco: Never  Vaping Use   Vaping Use: Never used  Substance Use Topics   Alcohol use: Yes    Alcohol/week: 1.0 standard drink    Types: 1 Glasses of wine per week  Comment: Occasionally   Drug use: Never     Review of Systems was negative for a full 10 system review except as noted in the History of Present Illness.   Current Outpatient Medications:    ascorbic acid (VITAMIN C) 100 MG tablet, , Disp: , Rfl:    aspirin EC 81 MG tablet, Take 81 mg by mouth daily., Disp: , Rfl:    b complex vitamins tablet, Take 1 tablet by mouth daily., Disp: , Rfl:    cholecalciferol (VITAMIN D) 25 MCG (1000 UT) tablet, Take 1 tablet by mouth daily. , Disp: , Rfl:     ciprofloxacin (CIPRO) 250 MG tablet, Take 250 mg by mouth 2 (two) times daily., Disp: , Rfl:    diphenhydrAMINE (BENADRYL) 25 mg capsule, Take 25 mg by mouth every 6 (six) hours as needed. PRN, Disp: , Rfl:    estradiol (ESTRACE) 0.1 MG/GM vaginal cream, Insert 0.5 grams nightly for 2 weeks, then 0.5 grams twice weekly, Disp: , Rfl:    Evolocumab (REPATHA SURECLICK) 140 MG/ML SOAJ, Inject 140 mg into the skin every 14 (fourteen) days., Disp: 6 mL, Rfl: 3   ezetimibe (ZETIA) 10 MG tablet, TAKE 1 TABLET DAILY, Disp: 90 tablet, Rfl: 2   latanoprost (XALATAN) 0.005 % ophthalmic solution, 1 drop at bedtime., Disp: , Rfl:    levothyroxine (SYNTHROID, LEVOTHROID) 88 MCG tablet, Take 88 mcg by mouth daily before breakfast., Disp: , Rfl:    Magnesium 400 MG CAPS, Take by mouth., Disp: , Rfl:    metoprolol succinate (TOPROL-XL) 25 MG 24 hr tablet, TAKE 1 TABLET DAILY, Disp: 90 tablet, Rfl: 3   naproxen sodium (ALEVE) 220 MG tablet, Take 220 mg by mouth. PRN, Disp: , Rfl:    TURMERIC PO, Take 800 mg by mouth daily., Disp: , Rfl:    Objective  Gen: NAD Abd: soft, nontender, pfannenstiel and midline incisions present  Previous Pelvic Exam showed: POP-Q (02/18/21):    POP-Q   3                                            Aa   3                                           Ba   -2.5                                              C    5.5                                            Gh   4                                            Pb   9  tvl    -3                                            Ap   -3                                            Bp                                                  D       Assessment/ Plan  Assessment: The patient is a 70 y.o. year old scheduled to undergo Exam under anesthesia, robotic sacrocolpopexy, midurethral sling and cystoscopy, possible sacrospinous fixation and anterior repair if robotic surgery not possible.  Verbal consent was obtained for these procedures.  Plan: General Surgical Consent: The patient has previously been counseled on alternative treatments, and the decision by the patient and provider was to proceed with the procedure listed above.  For all procedures, there are risks of bleeding, infection, damage to surrounding organs including but not limited to bowel, bladder, blood vessels, ureters and nerves, and need for further surgery if an injury were to occur. These risks are all low with minimally invasive surgery.   There are risks of numbness and weakness at any body site or buttock/rectal pain.  It is possible that baseline pain can be worsened by surgery, either with or without mesh. If surgery is vaginal, there is also a low risk of possible conversion to laparoscopy or open abdominal incision where indicated. Very rare risks include blood transfusion, blood clot, heart attack, pneumonia, or death.   There is also a risk of short-term postoperative urinary retention with need to use a catheter. About half of patients need to go home from surgery with a catheter, which is then later removed in the office. The risk of long-term need for a catheter is very low. There is also a risk of worsening of overactive bladder.   Sling: The effectiveness of a midurethral vaginal mesh sling is approximately 85%, and thus, there will be times when you may leak urine after surgery, especially if your bladder is full or if you have a strong cough. There is a balance between making the sling tight enough to treat your leakage but not too tight so that you have long-term difficulty emptying your bladder. A mesh sling will not directly treat overactive bladder/urge incontinence and may worsen it.  There is an FDA safety notification on vaginal mesh procedures for prolapse but NOT mesh slings. We have extensive experience and training with mesh placement and we have close postoperative follow up to identify any  potential complications from mesh. It is important to realize that this mesh is a permanent implant that cannot be easily removed. There are rare risks of mesh exposure (2-4%), pain with intercourse (0-7%), and infection (<1%). The risk of mesh exposure if more likely in a woman with risks for poor healing (prior radiation, poorly controlled diabetes, or immunocompromised). The risk of new or worsened chronic pain after mesh implant is more common in women with baseline chronic pain and/or poorly controlled anxiety or  depression. Approximately 2-4% of patients will experience longer-term post-operative voiding dysfunction that may require surgical revision of the sling. We also reviewed that postoperatively, her stream may not be as strong as before surgery.    Prolapse (with or without mesh): Risk factors for surgical failure  include things that put pressure on your pelvis and the surgical repair, including obesity, chronic cough, and heavy lifting or straining (including lifting children or adults, straining on the toilet, or lifting heavy objects such as furniture or anything weighing >25 lbs. Risks of recurrence is 20-30% with vaginal native tissue repair and a less than 10% with sacrocolpopexy with mesh.    Sacrocolpopexy: Mesh implants may provide more prolapse support, but do have some unique risks to consider. It is important to understand that mesh is permanent and cannot be easily removed. Risks of abdominal sacrocolpopexy mesh include mesh exposure (~3-6%), painful intercourse (recent studies show lower rates after surgery compared to before, with ~5-8% risk of new onset), and very rare risks of bowel or bladder injury or infection (<1%). The risk of mesh exposure is more likely in a woman with risks for poor healing (prior radiation, poorly controlled diabetes, or immunocompromised). The risk of new or worsened chronic pain after mesh implant is more common in women with baseline chronic pain  and/or poorly controlled anxiety or depression. There is an FDA safety notification on vaginal mesh procedures for prolapse but NOT abdominal mesh procedures and therefore does not apply to your surgery. We have extensive experience and training with mesh placement and we have close postoperative follow up to identify any potential complications from mesh.    We discussed consent for blood products. Risks for blood transfusion include allergic reactions, other reactions that can affect different body organs and managed accordingly, transmission of infectious diseases such as HIV or Hepatitis. However, the blood is screened. Patient consents for blood products.  Pre-operative instructions:  She was instructed to not take Aspirin/NSAIDs x 7days prior to surgery. She may continue her 81mg  ASA. Antibiotic prophylaxis was ordered as indicated.  Catheter use: Patient will go home with foley if needed after post-operative voiding trial.  Post-operative instructions:  She was provided with specific post-operative instructions, including precautions and signs/symptoms for which we would recommend contacting , in addition to daytime and after-hours contact phone numbers. This was provided on a handout.   Post-operative medications: Prescriptions for motrin, tylenol, miralax, and tramadol were sent to her pharmacy. Discussed using ibuprofen and tylenol on a schedule to limit use of narcotics.   Laboratory testing:  We will check labs: type and screen   Preoperative clearance:  She received clearance from Cardiology- acceptable risk  Post-operative follow-up:  A post-operative appointment will be made for 6 weeks from the date of surgery. If she needs a post-operative nurse visit for a voiding trial, that will be set up after she leaves the hospital.    Patient will call the clinic or use MyChart should anything change or any new issues arise.   [place], MD

## 2021-05-08 ENCOUNTER — Encounter: Payer: Self-pay | Admitting: Obstetrics and Gynecology

## 2021-05-08 DIAGNOSIS — N993 Prolapse of vaginal vault after hysterectomy: Secondary | ICD-10-CM

## 2021-05-08 NOTE — H&P (Signed)
[facility] Pre-Operative H&P  Subjective Chief Complaint: Ashley Morse presents for a preoperative encounter.   History of Present Illness: Ashley Morse is a 70 y.o. female who presents for preoperative visit.  She is scheduled to undergo Exam under anesthesia, robotic sacrocolpopexy, midurethral sling and cystoscopy, possible sacrospinous fixation and anterior repair if robotic surgery not possible  on 05/27/20.  Her symptoms include vaginal bulge and incontinence, and she was was found to have Stage III anterior, Stage I posterior, Stage I apical prolapse .   Simple CMG showed: CMG showed normal sensation, and normal cystometric capacity. Findings positive for stress incontinence, negative for detrusor overactivity.   Past Medical History:  Diagnosis Date   Bilateral venous insufficiency    Bilateral lower extremity edema,significant varicose vein; s/p vein stripping and venous abaltionof greater saph. vein  --> proved to be unsuccessful 2/2 vessel size.; Occasionally wears compression stockings during the wintertime.  cannot tolerate during the summertime;    CAD S/P percutaneous coronary angioplasty May 2010   Distal RPDA: 2 overlapping Promus and DES 2.5 mm x 15 mm, 2.5 mm 23 mm distal to mid RCA. EF 50%. [place], [place])    Hemorrhoids    History of: ST elevation myocardial infarction (STEMI) of inferior wall, subsequent episode of care May 2010   R- PDA occlusion --> PCI with DES   Hyperlipidemia    on simvastatin stopped due to increase liver enzyme rechecked back to normal   Hypertension    Hypothyroidism    On levothyroxine   Osteoarthritis    Presence of drug coated stent in right coronary artery May 2010   See above; no longer on DAPT.   Varicose veins      Past Surgical History:  Procedure Laterality Date   ABDOMINAL HYSTERECTOMY     CARDIAC CATHETERIZATION  10/23/2008   in [place]- for inferior STEMI, occluded RPDA   CORONARY  ANGIOPLASTY WITH STENT PLACEMENT  10/23/2008   occulsion of RPDA  -- 2 Promus DES stent overlapping ;2.5 x 15 and 2.5 x 23 mm stents from distal RCA to PDA, ef50%   CYSTOCELE REPAIR     ENDOVENOUS ABLATION SAPHENOUS VEIN W/ LASER Left 08/01/2015   endovenous laser ablation left greater saphenous vein by Gretta Began MD   ENDOVENOUS ABLATION SAPHENOUS VEIN W/ LASER Right 08/15/2015   endovenous laser ablation right greater saphenous vein by Gretta Began MD   left venous  duplex Left 08/31/2011   Negative for DVT   Radiofrequency Ablation - Left Greater Saphenous Vein: Unsuccessful Left 08/31/2011   TRANSTHORACIC ECHOCARDIOGRAM  09/15/2008    Echo, May 2010 - following PCI for inferior STEMI. No wall motion abnormalities, EF of 60%   VARICOSE VEIN SURGERY Right     is allergic to benzalkonium chloride, lactose, lactose intolerance (gi), lisinopril, pravastatin, statins, sulfa antibiotics, sulfamethoxazole-trimethoprim, zetia [ezetimibe], and prednisone.   Family History  Problem Relation Age of Onset   Stroke Mother    Heart disease Mother    Varicose Veins Mother    Diabetes Sister    Lymphoma Daughter        Lymphoma   Varicose Veins Paternal Grandfather     Social History   Tobacco Use   Smoking status: Never   Smokeless tobacco: Never  Vaping Use   Vaping Use: Never used  Substance Use Topics   Alcohol use: Yes    Alcohol/week: 1.0 standard drink    Types: 1 Glasses of wine per week  Comment: Occasionally   Drug use: Never     Review of Systems was negative for a full 10 system review except as noted in the History of Present Illness.  No current facility-administered medications for this encounter.  Current Outpatient Medications:    acetaminophen (TYLENOL) 500 MG tablet, Take 1 tablet (500 mg total) by mouth every 6 (six) hours as needed (pain)., Disp: 30 tablet, Rfl: 0   ascorbic acid (VITAMIN C) 100 MG tablet, , Disp: , Rfl:    aspirin EC 81 MG tablet, Take 81  mg by mouth daily., Disp: , Rfl:    b complex vitamins tablet, Take 1 tablet by mouth daily., Disp: , Rfl:    cholecalciferol (VITAMIN D) 25 MCG (1000 UT) tablet, Take 1 tablet by mouth daily. , Disp: , Rfl:    ciprofloxacin (CIPRO) 250 MG tablet, Take 250 mg by mouth 2 (two) times daily., Disp: , Rfl:    diphenhydrAMINE (BENADRYL) 25 mg capsule, Take 25 mg by mouth every 6 (six) hours as needed. PRN, Disp: , Rfl:    estradiol (ESTRACE) 0.1 MG/GM vaginal cream, Insert 0.5 grams nightly for 2 weeks, then 0.5 grams twice weekly, Disp: , Rfl:    Evolocumab (REPATHA SURECLICK) 140 MG/ML SOAJ, Inject 140 mg into the skin every 14 (fourteen) days., Disp: 6 mL, Rfl: 3   ezetimibe (ZETIA) 10 MG tablet, TAKE 1 TABLET DAILY, Disp: 90 tablet, Rfl: 2   ibuprofen (ADVIL) 600 MG tablet, Take 1 tablet (600 mg total) by mouth every 6 (six) hours as needed., Disp: 30 tablet, Rfl: 0   latanoprost (XALATAN) 0.005 % ophthalmic solution, 1 drop at bedtime., Disp: , Rfl:    levothyroxine (SYNTHROID, LEVOTHROID) 88 MCG tablet, Take 88 mcg by mouth daily before breakfast., Disp: , Rfl:    Magnesium 400 MG CAPS, Take by mouth., Disp: , Rfl:    metoprolol succinate (TOPROL-XL) 25 MG 24 hr tablet, TAKE 1 TABLET DAILY, Disp: 90 tablet, Rfl: 3   naproxen sodium (ALEVE) 220 MG tablet, Take 220 mg by mouth. PRN, Disp: , Rfl:    polyethylene glycol powder (GLYCOLAX/MIRALAX) 17 GM/SCOOP powder, Take 17 g by mouth daily. Drink 17g (1 scoop) dissolved in water per day., Disp: 255 g, Rfl: 0   traMADol (ULTRAM) 50 MG tablet, Take 1 tablet (50 mg total) by mouth every 8 (eight) hours as needed for up to 5 days., Disp: 10 tablet, Rfl: 0   TURMERIC PO, Take 800 mg by mouth daily., Disp: , Rfl:    Objective  Gen: NAD Abd: soft, nontender, pfannenstiel and midline incisions present  Previous Pelvic Exam showed: POP-Q (02/18/21):    POP-Q   3                                            Aa   3                                            Ba   -2.5                                              C  5.5                                            Gh   4                                            Pb   9                                            tvl    -3                                            Ap   -3                                            Bp                                                  D       Assessment/ Plan  Assessment: The patient is a 70 y.o. year old with stage III POP. Plan for:  Exam under anesthesia, robotic sacrocolpopexy, midurethral sling and cystoscopy, possible sacrospinous fixation and anterior repair if robotic surgery not possible.   Preoperative clearance:  She received clearance from Cardiology- acceptable risk    [name], MD

## 2021-05-09 MED ORDER — TRAMADOL HCL 50 MG PO TABS: 50.0000 mg | ORAL_TABLET | Freq: Three times a day (TID) | ORAL | 0 refills | Status: AC | PRN

## 2021-05-09 MED ORDER — ACETAMINOPHEN 500 MG PO TABS: 500.0000 mg | ORAL_TABLET | Freq: Four times a day (QID) | ORAL | 0 refills | Status: AC | PRN

## 2021-05-09 MED ORDER — POLYETHYLENE GLYCOL 3350 17 GM/SCOOP PO POWD: 17.0000 g | Freq: Every day | ORAL | 0 refills | Status: AC

## 2021-05-09 MED ORDER — IBUPROFEN 600 MG PO TABS: 600.0000 mg | ORAL_TABLET | Freq: Four times a day (QID) | ORAL | 0 refills | Status: DC | PRN

## 2021-05-13 NOTE — Telephone Encounter (Signed)
[facility] was called and they said they never received the orders on 05/09/21. I went ahead and call in the prescriptions the pharmacy Pt was notified Tylenol and Miralax will be over the counter but the pharmacist will assist in picking up the correct medication.

## 2021-05-21 ENCOUNTER — Encounter (HOSPITAL_BASED_OUTPATIENT_CLINIC_OR_DEPARTMENT_OTHER): Payer: Self-pay | Admitting: Obstetrics and Gynecology

## 2021-05-21 ENCOUNTER — Other Ambulatory Visit: Payer: Self-pay

## 2021-05-21 NOTE — Progress Notes (Addendum)
Spoke w/ via phone for pre-op interview---pt Lab needs dos----ISTAT, type & screen, ask MDA if CMP is needed due to possible hx of Hep C & elevated liver enzymes              Lab results------04/07/21 EKG in chart & Epic COVID test -----patient states asymptomatic no test needed Arrive at -------0530 on 05/27/21 NPO after MN NO Solid Food.  Clear liquids from MN until---0430 Med rec completed Medications to take morning of surgery -----Synthroid, Metoprolol Diabetic medication -----n/a Patient instructed no nail polish to be worn day of surgery Patient instructed to bring photo id and insurance card day of surgery Patient aware to have Driver (ride ) / caregiver    for 24 hours after surgery - husband, Ashley Morse Patient Special Instructions -----Extended recovery instructions given to patient Pre-Op special Istructions -----none Patient verbalized understanding of instructions that were given at this phone interview. Patient denies shortness of breath, chest pain, fever, cough at this phone interview.   Per 05/20/21 phone call, pt is unsure whether she has Hepatitis C. She stated that many years ago the blood bank told her that she could no longer give blood. She said she had a liver biopsy. She was offered treatment for Hep C, but declined. She states that it was unclear to her whether she actually had Hep C.  Cardiologist: Dr. Ellyn Hack LOV 04/04/2021 with [name], NP. Cardiac clearance was given at this time. Copy in chart. Patient has a hx of a cardiac catherization with PCI following a STEMI in 2010. She has significant bilateral venous reflux swelling (s/p multiple ablations followed by CVRR). She currently denies any CP or SOB.

## 2021-05-22 ENCOUNTER — Encounter (HOSPITAL_BASED_OUTPATIENT_CLINIC_OR_DEPARTMENT_OTHER): Payer: Self-pay | Admitting: Obstetrics and Gynecology

## 2021-05-22 NOTE — Progress Notes (Signed)
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

## 2021-05-27 ENCOUNTER — Ambulatory Visit (HOSPITAL_COMMUNITY)
Admission: RE | Admit: 2021-05-27 | Discharge: 2021-05-27 | Disposition: A | Discharge status: Home / Self Care | Payer: Medicare Other | Source: Home / Self Care | Attending: Obstetrics and Gynecology | Admitting: Obstetrics and Gynecology

## 2021-05-27 ENCOUNTER — Encounter (HOSPITAL_BASED_OUTPATIENT_CLINIC_OR_DEPARTMENT_OTHER)
Admission: RE | Disposition: A | Discharge status: Home / Self Care | Payer: Self-pay | Source: Home / Self Care | Attending: Obstetrics and Gynecology

## 2021-05-27 ENCOUNTER — Ambulatory Visit (HOSPITAL_BASED_OUTPATIENT_CLINIC_OR_DEPARTMENT_OTHER): Payer: Medicare Other | Admitting: Anesthesiology

## 2021-05-27 ENCOUNTER — Encounter (HOSPITAL_BASED_OUTPATIENT_CLINIC_OR_DEPARTMENT_OTHER): Payer: Self-pay | Admitting: Obstetrics and Gynecology

## 2021-05-27 ENCOUNTER — Ambulatory Visit (HOSPITAL_BASED_OUTPATIENT_CLINIC_OR_DEPARTMENT_OTHER)
Admission: RE | Admit: 2021-05-27 | Discharge: 2021-05-28 | Disposition: A | Discharge status: Home / Self Care | Payer: Medicare Other | Attending: Obstetrics and Gynecology | Admitting: Obstetrics and Gynecology

## 2021-05-27 DIAGNOSIS — N993 Prolapse of vaginal vault after hysterectomy: Secondary | ICD-10-CM | POA: Diagnosis present

## 2021-05-27 DIAGNOSIS — N8110 Cystocele, unspecified: Secondary | ICD-10-CM | POA: Diagnosis not present

## 2021-05-27 DIAGNOSIS — N393 Stress incontinence (female) (male): Secondary | ICD-10-CM | POA: Diagnosis not present

## 2021-05-27 DIAGNOSIS — S00432A Contusion of left ear, initial encounter: Secondary | ICD-10-CM | POA: Insufficient documentation

## 2021-05-27 DIAGNOSIS — K660 Peritoneal adhesions (postprocedural) (postinfection): Secondary | ICD-10-CM | POA: Diagnosis not present

## 2021-05-27 DIAGNOSIS — R9082 White matter disease, unspecified: Secondary | ICD-10-CM | POA: Insufficient documentation

## 2021-05-27 DIAGNOSIS — X58XXXA Exposure to other specified factors, initial encounter: Secondary | ICD-10-CM | POA: Insufficient documentation

## 2021-05-27 HISTORY — DX: Personal history of other medical treatment: Z92.89

## 2021-05-27 HISTORY — PX: CYSTOSCOPY: SHX5120

## 2021-05-27 HISTORY — PX: BLADDER SUSPENSION: SHX72

## 2021-05-27 HISTORY — DX: Inflammatory liver disease, unspecified: K75.9

## 2021-05-27 HISTORY — PX: ROBOTIC ASSISTED LAPAROSCOPIC SACROCOLPOPEXY: SHX5388

## 2021-05-27 HISTORY — DX: Unspecified viral hepatitis C without hepatic coma: B19.20

## 2021-05-27 HISTORY — DX: Cardiac murmur, unspecified: R01.1

## 2021-05-27 HISTORY — DX: Other complications of anesthesia, initial encounter: T88.59XA

## 2021-05-27 HISTORY — DX: Family history of other specified conditions: Z84.89

## 2021-05-27 LAB — POCT I-STAT, CHEM 8
BUN: 16 mg/dL (ref 8–23)
Calcium, Ion: 1.2 mmol/L (ref 1.15–1.40)
Chloride: 104 mmol/L (ref 98–111)
Creatinine, Ser: 0.6 mg/dL (ref 0.44–1.00)
Glucose, Bld: 92 mg/dL (ref 70–99)
HCT: 41 % (ref 36.0–46.0)
Hemoglobin: 13.9 g/dL (ref 12.0–15.0)
Potassium: 4.6 mmol/L (ref 3.5–5.1)
Sodium: 141 mmol/L (ref 135–145)
TCO2: 30 mmol/L (ref 22–32)

## 2021-05-27 LAB — ABO/RH: ABO/RH(D): O NEG

## 2021-05-27 LAB — TYPE AND SCREEN
ABO/RH(D): O NEG
Antibody Screen: NEGATIVE

## 2021-05-27 SURGERY — SACROCOLPOPEXY, ROBOT-ASSISTED, LAPAROSCOPIC
Anesthesia: General | Site: Vagina

## 2021-05-27 MED ORDER — LIDOCAINE 2% (20 MG/ML) 5 ML SYRINGE
INTRAMUSCULAR | Status: DC | PRN
  Administered 2021-05-27: 1.5 mg/kg/h via INTRAVENOUS

## 2021-05-27 MED ORDER — PROPOFOL 10 MG/ML IV BOLUS
INTRAVENOUS | Status: AC
  Filled 2021-05-27: qty 20

## 2021-05-27 MED ORDER — OXYCODONE HCL 5 MG PO TABS: 5.0000 mg | ORAL_TABLET | ORAL | Status: DC | PRN

## 2021-05-27 MED ORDER — ONDANSETRON HCL 4 MG/2ML IJ SOLN
INTRAMUSCULAR | Status: AC
  Filled 2021-05-27: qty 2

## 2021-05-27 MED ORDER — SIMETHICONE 80 MG PO CHEW: 80.0000 mg | CHEWABLE_TABLET | Freq: Four times a day (QID) | ORAL | Status: DC | PRN

## 2021-05-27 MED ORDER — LIDOCAINE-EPINEPHRINE 1 %-1:100000 IJ SOLN
INTRAMUSCULAR | Status: DC | PRN
  Administered 2021-05-27: 7 mL

## 2021-05-27 MED ORDER — ONDANSETRON HCL 4 MG PO TABS: 4.0000 mg | ORAL_TABLET | Freq: Four times a day (QID) | ORAL | Status: DC | PRN

## 2021-05-27 MED ORDER — ACETAMINOPHEN 500 MG PO TABS
1000.0000 mg | ORAL_TABLET | ORAL | Status: AC
  Administered 2021-05-27: 1000 mg via ORAL

## 2021-05-27 MED ORDER — ACETAMINOPHEN 160 MG/5ML PO SOLN: 1000.0000 mg | Freq: Once | ORAL | Status: DC | PRN

## 2021-05-27 MED ORDER — BUPIVACAINE HCL (PF) 0.25 % IJ SOLN
INTRAMUSCULAR | Status: DC | PRN
  Administered 2021-05-27: 11 mL

## 2021-05-27 MED ORDER — BSS IO SOLN
INTRAOCULAR | Status: AC
  Filled 2021-05-27: qty 15

## 2021-05-27 MED ORDER — FENTANYL CITRATE (PF) 100 MCG/2ML IJ SOLN
INTRAMUSCULAR | Status: AC
  Filled 2021-05-27: qty 2

## 2021-05-27 MED ORDER — WATER FOR IRRIGATION, STERILE IR SOLN
Status: DC | PRN
  Administered 2021-05-27: 500 mL

## 2021-05-27 MED ORDER — LIDOCAINE 2% (20 MG/ML) 5 ML SYRINGE
INTRAMUSCULAR | Status: AC
  Filled 2021-05-27: qty 5

## 2021-05-27 MED ORDER — HYDROMORPHONE HCL 2 MG/ML IJ SOLN
INTRAMUSCULAR | Status: AC
  Filled 2021-05-27: qty 1

## 2021-05-27 MED ORDER — LATANOPROST 0.005 % OP SOLN
1.0000 [drp] | Freq: Every day | OPHTHALMIC | Status: DC
  Administered 2021-05-27: 1 [drp] via OPHTHALMIC
  Filled 2021-05-27: qty 2.5

## 2021-05-27 MED ORDER — ACETAMINOPHEN 10 MG/ML IV SOLN: 1000.0000 mg | Freq: Once | INTRAVENOUS | Status: DC | PRN

## 2021-05-27 MED ORDER — ASPIRIN EC 81 MG PO TBEC
81.0000 mg | DELAYED_RELEASE_TABLET | Freq: Every day | ORAL | Status: DC
  Filled 2021-05-27: qty 1

## 2021-05-27 MED ORDER — ACETAMINOPHEN 500 MG PO TABS: 1000.0000 mg | ORAL_TABLET | Freq: Once | ORAL | Status: DC | PRN

## 2021-05-27 MED ORDER — PHENAZOPYRIDINE HCL 100 MG PO TABS
200.0000 mg | ORAL_TABLET | ORAL | Status: AC
  Administered 2021-05-27: 200 mg via ORAL

## 2021-05-27 MED ORDER — BSS IO SOLN
15.0000 mL | Freq: Once | INTRAOCULAR | Status: AC
  Administered 2021-05-27: 15 mL

## 2021-05-27 MED ORDER — LIDOCAINE 2% (20 MG/ML) 5 ML SYRINGE
INTRAMUSCULAR | Status: DC | PRN
  Administered 2021-05-27: 50 mg via INTRAVENOUS

## 2021-05-27 MED ORDER — SUGAMMADEX SODIUM 200 MG/2ML IV SOLN
INTRAVENOUS | Status: DC | PRN
  Administered 2021-05-27: 200 mg via INTRAVENOUS

## 2021-05-27 MED ORDER — PHENAZOPYRIDINE HCL 100 MG PO TABS
ORAL_TABLET | ORAL | Status: AC
  Filled 2021-05-27: qty 2

## 2021-05-27 MED ORDER — FENTANYL CITRATE (PF) 100 MCG/2ML IJ SOLN
INTRAMUSCULAR | Status: DC | PRN
  Administered 2021-05-27 (×2): 25 ug via INTRAVENOUS
  Administered 2021-05-27 (×3): 50 ug via INTRAVENOUS

## 2021-05-27 MED ORDER — ACETAMINOPHEN 325 MG PO TABS: 650.0000 mg | ORAL_TABLET | ORAL | Status: DC | PRN

## 2021-05-27 MED ORDER — ROCURONIUM BROMIDE 10 MG/ML (PF) SYRINGE
PREFILLED_SYRINGE | INTRAVENOUS | Status: DC | PRN
  Administered 2021-05-27: 10 mg via INTRAVENOUS
  Administered 2021-05-27: 20 mg via INTRAVENOUS
  Administered 2021-05-27: 60 mg via INTRAVENOUS

## 2021-05-27 MED ORDER — CEFAZOLIN SODIUM-DEXTROSE 2-4 GM/100ML-% IV SOLN
2.0000 g | INTRAVENOUS | Status: AC
  Administered 2021-05-27: 2 g via INTRAVENOUS

## 2021-05-27 MED ORDER — KETOROLAC TROMETHAMINE 15 MG/ML IJ SOLN
INTRAMUSCULAR | Status: DC | PRN
Start: 2021-05-27 — End: 2021-05-27
  Administered 2021-05-27: 15 mg via INTRAVENOUS

## 2021-05-27 MED ORDER — ACETAMINOPHEN 325 MG PO TABS
ORAL_TABLET | ORAL | Status: AC
  Filled 2021-05-27: qty 2

## 2021-05-27 MED ORDER — HYDROMORPHONE HCL 1 MG/ML IJ SOLN
INTRAMUSCULAR | Status: DC | PRN
  Administered 2021-05-27 (×2): 1 mg via INTRAVENOUS

## 2021-05-27 MED ORDER — EZETIMIBE 10 MG PO TABS
10.0000 mg | ORAL_TABLET | Freq: Every day | ORAL | Status: DC
  Administered 2021-05-27: 10 mg via ORAL
  Filled 2021-05-27: qty 1

## 2021-05-27 MED ORDER — PROPOFOL 10 MG/ML IV BOLUS
INTRAVENOUS | Status: DC | PRN
  Administered 2021-05-27: 130 mg via INTRAVENOUS

## 2021-05-27 MED ORDER — FENTANYL CITRATE (PF) 100 MCG/2ML IJ SOLN: 25.0000 ug | INTRAMUSCULAR | Status: DC | PRN

## 2021-05-27 MED ORDER — ONDANSETRON HCL 4 MG/2ML IJ SOLN
4.0000 mg | Freq: Four times a day (QID) | INTRAMUSCULAR | Status: DC | PRN
  Administered 2021-05-27 – 2021-05-28 (×2): 4 mg via INTRAVENOUS

## 2021-05-27 MED ORDER — EPHEDRINE SULFATE-NACL 50-0.9 MG/10ML-% IV SOSY
PREFILLED_SYRINGE | INTRAVENOUS | Status: DC | PRN
Start: 2021-05-27 — End: 2021-05-27
  Administered 2021-05-27 (×2): 10 mg via INTRAVENOUS

## 2021-05-27 MED ORDER — SODIUM CHLORIDE 0.9 % IR SOLN
Status: DC | PRN
  Administered 2021-05-27 (×2): 1000 mL

## 2021-05-27 MED ORDER — LACTATED RINGERS IV SOLN: INTRAVENOUS | Status: DC

## 2021-05-27 MED ORDER — OXYCODONE HCL 5 MG/5ML PO SOLN: 5.0000 mg | Freq: Once | ORAL | Status: DC | PRN

## 2021-05-27 MED ORDER — POVIDONE-IODINE 10 % EX SWAB: 2.0000 "application " | Freq: Once | CUTANEOUS | Status: DC

## 2021-05-27 MED ORDER — OXYCODONE HCL 5 MG PO TABS: 5.0000 mg | ORAL_TABLET | Freq: Once | ORAL | Status: DC | PRN

## 2021-05-27 MED ORDER — LEVOTHYROXINE SODIUM 88 MCG PO TABS
88.0000 ug | ORAL_TABLET | Freq: Every day | ORAL | Status: DC
  Administered 2021-05-28: 88 ug via ORAL
  Filled 2021-05-27: qty 1

## 2021-05-27 MED ORDER — EPHEDRINE 5 MG/ML INJ
INTRAVENOUS | Status: AC
  Filled 2021-05-27: qty 5

## 2021-05-27 MED ORDER — METOPROLOL SUCCINATE ER 25 MG PO TB24
25.0000 mg | ORAL_TABLET | Freq: Every day | ORAL | Status: DC
Start: 2021-05-28 — End: 2021-05-28
  Filled 2021-05-27: qty 1

## 2021-05-27 MED ORDER — ACETAMINOPHEN 500 MG PO TABS
ORAL_TABLET | ORAL | Status: AC
  Filled 2021-05-27: qty 2

## 2021-05-27 MED ORDER — ONDANSETRON HCL 4 MG/2ML IJ SOLN
INTRAMUSCULAR | Status: DC | PRN
  Administered 2021-05-27: 4 mg via INTRAVENOUS

## 2021-05-27 MED ORDER — CEFAZOLIN SODIUM-DEXTROSE 2-4 GM/100ML-% IV SOLN
INTRAVENOUS | Status: AC
  Filled 2021-05-27: qty 100

## 2021-05-27 MED ORDER — ROCURONIUM BROMIDE 10 MG/ML (PF) SYRINGE
PREFILLED_SYRINGE | INTRAVENOUS | Status: AC
  Filled 2021-05-27: qty 10

## 2021-05-27 MED ORDER — KETOROLAC TROMETHAMINE 30 MG/ML IJ SOLN
INTRAMUSCULAR | Status: AC
  Filled 2021-05-27: qty 1

## 2021-05-27 MED ORDER — PHENYLEPHRINE 40 MCG/ML (10ML) SYRINGE FOR IV PUSH (FOR BLOOD PRESSURE SUPPORT)
PREFILLED_SYRINGE | INTRAVENOUS | Status: AC
  Filled 2021-05-27: qty 10

## 2021-05-27 SURGICAL SUPPLY — 89 items
ADH SKN CLS APL DERMABOND .7 (GAUZE/BANDAGES/DRESSINGS) ×6
AGENT HMST KT MTR STRL THRMB (HEMOSTASIS)
APL ESCP 34 STRL LF DISP (HEMOSTASIS)
APL PRP STRL LF DISP 70% ISPRP (MISCELLANEOUS) ×3
APPLICATOR SURGIFLO ENDO (HEMOSTASIS) IMPLANT
BLADE CLIPPER SENSICLIP SURGIC (BLADE) ×3 IMPLANT
BLADE SURG 15 STRL LF DISP TIS (BLADE) ×3 IMPLANT
BLADE SURG 15 STRL SS (BLADE) ×4
CATH FOLEY 3WAY  5CC 16FR (CATHETERS) ×1
CATH FOLEY 3WAY 5CC 16FR (CATHETERS) ×3 IMPLANT
CHLORAPREP W/TINT 26 (MISCELLANEOUS) ×4 IMPLANT
COVER BACK TABLE 60X90IN (DRAPES) ×4 IMPLANT
COVER TIP SHEARS 8 DVNC (MISCELLANEOUS) ×3 IMPLANT
COVER TIP SHEARS 8MM DA VINCI (MISCELLANEOUS) ×1
DECANTER SPIKE VIAL GLASS SM (MISCELLANEOUS) ×8 IMPLANT
DEFOGGER SCOPE WARMER CLEARIFY (MISCELLANEOUS) ×4 IMPLANT
DERMABOND ADVANCED (GAUZE/BANDAGES/DRESSINGS) ×2
DERMABOND ADVANCED .7 DNX12 (GAUZE/BANDAGES/DRESSINGS) ×3 IMPLANT
DEVICE CAPIO SLIM SINGLE (INSTRUMENTS) IMPLANT
DISSECTOR BLUNT TIP ENDO 5MM (MISCELLANEOUS) ×1 IMPLANT
DRAPE ARM DVNC X/XI (DISPOSABLE) ×12 IMPLANT
DRAPE COLUMN DVNC XI (DISPOSABLE) ×3 IMPLANT
DRAPE DA VINCI XI ARM (DISPOSABLE) ×4
DRAPE DA VINCI XI COLUMN (DISPOSABLE) ×1
DRAPE SHEET LG 3/4 BI-LAMINATE (DRAPES) IMPLANT
DRAPE UTILITY XL STRL (DRAPES) ×4 IMPLANT
ELECT REM PT RETURN 9FT ADLT (ELECTROSURGICAL) ×4
ELECTRODE REM PT RTRN 9FT ADLT (ELECTROSURGICAL) ×3 IMPLANT
GAUZE 4X4 16PLY ~~LOC~~+RFID DBL (SPONGE) ×8 IMPLANT
GLOVE SURG ENC MOIS LTX SZ6 (GLOVE) ×16 IMPLANT
GLOVE SURG POLYISO LF SZ6.5 (GLOVE) ×3 IMPLANT
GLOVE SURG UNDER POLY LF SZ6.5 (GLOVE) ×21 IMPLANT
GLOVE SURG UNDER POLY LF SZ7 (GLOVE) ×4 IMPLANT
GLOVE SURG UNDER POLY LF SZ7.5 (GLOVE) ×1 IMPLANT
GOWN STRL REUS W/TWL LRG LVL3 (GOWN DISPOSABLE) ×4 IMPLANT
HIBICLENS CHG 4% 4OZ BTL (MISCELLANEOUS) ×4 IMPLANT
HOLDER FOLEY CATH W/STRAP (MISCELLANEOUS) ×4 IMPLANT
IRRIG SUCT STRYKERFLOW 2 WTIP (MISCELLANEOUS) ×4
IRRIGATION SUCT STRKRFLW 2 WTP (MISCELLANEOUS) ×3 IMPLANT
KIT TURNOVER CYSTO (KITS) ×4 IMPLANT
LEGGING LITHOTOMY PAIR STRL (DRAPES) ×4 IMPLANT
MANIFOLD NEPTUNE II (INSTRUMENTS) ×4 IMPLANT
MANIPULATOR ADVINCU DEL 2.5 PL (MISCELLANEOUS) IMPLANT
MANIPULATOR ADVINCU DEL 3.0 PL (MISCELLANEOUS) IMPLANT
MANIPULATOR ADVINCU DEL 3.5 PL (MISCELLANEOUS) IMPLANT
MANIPULATOR ADVINCU DEL 4.0 PL (MISCELLANEOUS) IMPLANT
MESH VERTESSA LITE -Y 2X4X3 (Mesh General) ×4 IMPLANT
NEEDLE HYPO 22GX1.5 SAFETY (NEEDLE) ×4 IMPLANT
NEEDLE INSUFFLATION 120MM (ENDOMECHANICALS) ×4 IMPLANT
NS IRRIG 1000ML POUR BTL (IV SOLUTION) ×4 IMPLANT
OBTURATOR OPTICAL STANDARD 8MM (TROCAR) ×1
OBTURATOR OPTICAL STND 8 DVNC (TROCAR) ×3
OBTURATOR OPTICALSTD 8 DVNC (TROCAR) ×3 IMPLANT
PACK CYSTO (CUSTOM PROCEDURE TRAY) ×4 IMPLANT
PACK ROBOT WH (CUSTOM PROCEDURE TRAY) ×4 IMPLANT
PACK ROBOTIC GOWN (GOWN DISPOSABLE) ×4 IMPLANT
PACK VAGINAL WOMENS (CUSTOM PROCEDURE TRAY) ×4 IMPLANT
PAD OB MATERNITY 4.3X12.25 (PERSONAL CARE ITEMS) ×4 IMPLANT
PAD POSITIONING PINK XL (MISCELLANEOUS) ×4 IMPLANT
PAD PREP 24X48 CUFFED NSTRL (MISCELLANEOUS) ×4 IMPLANT
POUCH LAPAROSCOPIC INSTRUMENT (MISCELLANEOUS) IMPLANT
PROTECTOR NERVE ULNAR (MISCELLANEOUS) ×4 IMPLANT
RETRACTOR LONE STAR DISPOSABLE (INSTRUMENTS) ×4 IMPLANT
RETRACTOR STAY HOOK 5MM (MISCELLANEOUS) ×4 IMPLANT
SEAL CANN UNIV 5-8 DVNC XI (MISCELLANEOUS) ×12 IMPLANT
SEAL XI 5MM-8MM UNIVERSAL (MISCELLANEOUS) ×4
SET IRRIG Y TYPE TUR BLADDER L (SET/KITS/TRAYS/PACK) ×4 IMPLANT
SET TUBE SMOKE EVAC HIGH FLOW (TUBING) ×4 IMPLANT
SPONGE T-LAP 4X18 ~~LOC~~+RFID (SPONGE) IMPLANT
SUCTION FRAZIER HANDLE 10FR (MISCELLANEOUS) ×1
SUCTION TUBE FRAZIER 10FR DISP (MISCELLANEOUS) ×3 IMPLANT
SURGIFLO W/THROMBIN 8M KIT (HEMOSTASIS) IMPLANT
SUT ABS MONO DBL WITH NDL 48IN (SUTURE) IMPLANT
SUT CV-0 GORETEX TFX25 36 (SUTURE) ×3 IMPLANT
SUT GORETEX NAB #0 THX26 36IN (SUTURE) IMPLANT
SUT MNCRL AB 4-0 PS2 18 (SUTURE) ×4 IMPLANT
SUT MON AB 2-0 SH 27 (SUTURE) ×4 IMPLANT
SUT VIC AB 0 CT1 27 (SUTURE)
SUT VIC AB 0 CT1 27XBRD ANTBC (SUTURE) IMPLANT
SUT VIC AB 2-0 SH 27 (SUTURE) ×4
SUT VIC AB 2-0 SH 27XBRD (SUTURE) ×3 IMPLANT
SUT VICRYL 2-0 SH 8X27 (SUTURE) IMPLANT
SUT VLOC 180 2-0 9IN GS21 (SUTURE) ×8 IMPLANT
SYR BULB EAR ULCER 3OZ GRN STR (SYRINGE) ×4 IMPLANT
SYS SLING ADV FIT BLUE TRNSVAG (Sling) ×1 IMPLANT
TOWEL OR 17X26 10 PK STRL BLUE (TOWEL DISPOSABLE) ×4 IMPLANT
TRAY FOLEY W/BAG SLVR 14FR (SET/KITS/TRAYS/PACK) ×4 IMPLANT
TROCAR BLADELESS OPT 5 100 (ENDOMECHANICALS) ×1 IMPLANT
TROCAR XCEL NON BLADE 8MM B8LT (ENDOMECHANICALS) ×4 IMPLANT

## 2021-05-27 NOTE — Progress Notes (Signed)
Patient resting no complaints at this time 

## 2021-05-27 NOTE — Interval H&P Note (Signed)
History and Physical Interval Note:  05/27/2021 7:20 AM  Ashley Morse  has presented today for surgery, with the diagnosis of vaginal vault prolapse after hysterectomy, prolapse of anterior vaginal wall, stress urinary incontinence.  The various methods of treatment have been discussed with the patient and family. After consideration of risks, benefits and other options for treatment, the patient has consented to  Procedure(s): XI ROBOTIC ASSISTED LAPAROSCOPIC SACROCOLPOPEXY (N/A) TRANSVAGINAL TAPE (TVT) PROCEDURE (N/A) CYSTOSCOPY (N/A) as a surgical intervention. Plan for vaginal sacrospinous fixation and anterior repair if robotic surgery not possible.  The patient's history has been reviewed, patient examined, no change in status, stable for surgery.  I have reviewed the patient's chart and labs.  Questions were answered to the patient's satisfaction.     [name]

## 2021-05-27 NOTE — Anesthesia Procedure Notes (Signed)
Procedure Name: Intubation Date/Time: 05/27/2021 7:45 AM Performed by: Suan Halter, CRNA Pre-anesthesia Checklist: Patient identified, Emergency Drugs available, Suction available and Patient being monitored Patient Re-evaluated:Patient Re-evaluated prior to induction Oxygen Delivery Method: Circle system utilized Preoxygenation: Pre-oxygenation with 100% oxygen Induction Type: IV induction Ventilation: Mask ventilation without difficulty Laryngoscope Size: Mac and 3 Grade View: Grade I Tube type: Oral Tube size: 7.0 mm Number of attempts: 1 Airway Equipment and Method: Stylet and Oral airway Placement Confirmation: ETT inserted through vocal cords under direct vision, positive ETCO2 and breath sounds checked- equal and bilateral Secured at: 22 cm Tube secured with: Tape Dental Injury: Teeth and Oropharynx as per pre-operative assessment

## 2021-05-27 NOTE — Progress Notes (Signed)
RN assisting patient to the bathroom she became  dizzy and vomited.  Patient was unable to ambulate to the BR.  Bedside commode was used and patient tolerate this well.

## 2021-05-27 NOTE — Progress Notes (Signed)
RN spoke to Dr Wannetta Sender about concerns with patients neurological status.  Patient seems very sleepy and patient states "I just don't feel right." Dr Wannetta Sender explained CT was negative and she is an older patient and could take longer to recover from medications given during surgery. We will continue to monitor patient and keep her overnight.  Minimal bleeding from left ear and O2 placed on patient due to oxygen levels in low 90's was also reported to Dr Wannetta Sender.  Patients husband by the bedside at this time.

## 2021-05-27 NOTE — Progress Notes (Signed)
Late entry note.  Upon awaking from anesthesia, bleeding from left ear was noted. Otoscope was used and pooling was noted in the ear canal. ENT was called and recommended outpatient follow up. In recovery, patient was noted to have bruising behind her ear and petechiae across her face. Patient had previously reported that she hit her head and bruised her forehead a few days earlier. Therefore, decision was made to proceed with non-contrast head CT to rule out intracranial process.   [name], MD

## 2021-05-27 NOTE — Op Note (Signed)
Operative Note  Preoperative Diagnosis: anterior vaginal prolapse, posterior vaginal prolapse, vaginal vault prolapse after hysterectomy, and stress urinary incontinence  Postoperative Diagnosis: same  Procedures performed:   Robotic assisted lysis of adhesions, sacrocolpopexy, midurethral sling, cystoscopy  Implants:  Implant Name Type Inv. Item Serial No. Manufacturer Lot No. LRB No. Used Action  MESH [employer] Ormond [id number] Mesh General MESH [employer]land Ormond [phone]  [facility] MEDICAL [id number] N/A 1 Implanted  SYS SLING ADV FIT BLUE TRNSVAG - [id number] Sling SYS SLING ADV FIT BLUE TRNSVAG  Norvel Richards [id number] N/A 1 Implanted    Attending Surgeon: Lanetta Inch, MD  Assistant Surgeon: Annamaria Boots, MD   Anesthesia: General endotracheal  Findings: 1. Adhesions noted on of the omentum to midline anterior abdominal wall and of the colon to the left pelvic sidewall.   2. Normal appearing right fallopian tube and ovary. Left fallopian tube and ovary not visualized due to bowel adhesions.    3. On cystoscopy, normal bladder and urethra without injury, lesion or foreign body. Brisk bilateral ureteral efflux noted.   Specimens: * No specimens in log *  Estimated blood loss: 50 mL  IV fluids: 200 mL  Urine output: 220 mL  Complications: none  Procedure in Detail:  After informed consent was obtained, the patient was taken to the operating room, where general anesthesia was induced and found to be adequate. She was placed in dorsolithotomy position in yellowfin stirrups. Her hips were noted not to be hyperflexed or hyperextended. Her arms were padded with gel pads and tucked to her sides. Her hands were surrounded by foam. A padded strap was placed across her chest with foam between the pad and her skin. She was noted to be appropriately positioned with all pressure points well padded and off tension. A tilt test showed no slippage. She was prepped and draped in the  usual sterile fashion.  A sterile Foley catheter was inserted.   0.25% plain Marcaine was injected at Palmer's point in the left upper quadrant and an incision was made with a scalpel. A Veress needle was inserted into the incision, CO2 insufflation was started, a low opening pressure was noted, and pneumoperitoneum was obtained. The Veress needle was removed and a 82mm optiview trocar was placed with direct visualization. Entry into the peritoneal cavity was confirmed. Dense omental adhesions were noted in the abdominal midline up to the level of the umbilicus.After determining placement for the other ports, Local anesthetic was injected at each site and two 8 mm incisions were made for robotic ports at 10 cm lateral to and at the level of the umbilical port. Two additional 8 mm incisions were made 10 cm lateral to these and 30 degrees down followed by 8 mm robotic ports - the right side for an assistant port. All trocars were placed sequentially under direct visualization of the camera. The patient was placed in Trendelenburg. The sacrum appeared to be free of any adhesive disease.The robot was docked on the patient's right side. Monopolar endoshears were placed in the left arm and a bipolar on the right. The adhesions were taken down in the midline, which took approximately 20 minutes. The instruments were then placed for the sacrocolpopexy procedure. Monopolar endoshears were placed in the right arm, a [place] bipolar grasper was placed in the 2nd arm of the patient's left side, and a Tip up grasper was placed in the 3rd arm on the patient's left side.    With a lucite probe  in the vagina, anterior vaginal dissection was then performed with sharp dissection and electrosurgery to separate the vesicovaginal space to the level of the urethra. The posterior vaginal dissection was then performed with sharp dissection and electrosurgery in order to dissect the rectum away from the posterior vagina. Attention was  then turned to the sacral promontory. The peritoneum overlying the sacral promontory was tented up, dissected sharply with monopolar scissors and electrosurgery using layer by layer technique. The overlying areolar and adipose tissue were taken down until the anterior longitudinal sacral ligament was identified. The peritoneal incision was extended down to the posterior cul-de-sac. This was performed with care to avoid the ureter on the right side and the sigmoid colon and its mesentary on the left side.   Small vessels were cauterized along the way to obtain excellent hemostasis. A "Y" mesh was then inserted into the abdomen after trimming to appropriate size. With the probe in the vagina, the anterior leaf of the Y mesh was affixed to the anterior portion of the vagina using a 2-0 v-loc suture in a spiral pattern to distribute the suture evenly across the surface of the anterior mesh leaf. In a similar fashion, the posterior leaf of the Y mesh was attached to the posterior surface of the vagina with 2-0 v-loc suture.  The distal end of the mesh was then brought to overlie the sacrum. The correct amount of tension was determined in order to elevate the vagina, but not put the mesh under tension. The distal end of the mesh was then affixed to the anterior longitudinal sacral ligament using two interrupted transverse stitches of CV0 Gortex. The excess distal mesh was then cut and removed. The peritoneum was reapproximated over the mesh using 2-0 monocryl. The bladder flap was incorporated to retroperitonealize the mesh. A 2cm central area of mesh was left exposed as the peritoneal edge was close to the right ureter. All pedicles were carefully inspected and noted to be hemostatic as the CO2 gas was deflated. All instruments were removed from the patient's abdomen.    The Foley catheter was removed.  A 70-degree cystoscope was introduced, and 360-degree inspection revealed no injury, lesion or foreign body in the  bladder. Brisk bilateral ureteral efflux was noted with the assistance of pyridium.  The bladder was drained and the cystoscope was removed.  The Foley catheter was replaced.   The robot was undocked. The CO2 gas was removed and the ports were removed.  The skin incisions were closed with subcutaneous stitches of 4-0 Monocryl and covered with skin glue.   Attention was turned to the sling. A lonestar self-retraining retractor was placed with 4 stay hooks. The mid urethral area was located on the anterior vaginal wall.  Two Allis clamps were placed at the level of the midurethra. 1% lidocaine with epinephrine was injected into the vaginal mucosa. A vertical incision was made between the two clamps using a 15-blade scalpel.  Using sharp dissection, Metzenbaum scissors were used to make a periurethral tunnel from the vaginal incision towards the pubic rami bilaterally for the future sling tracts. The bladder was ensured to be empty. The trocar and attached sling were introduced into the right side of the periurethral vaginal incision, just inferior to the pubic symphysis on the right side. The trocar was guided through the endopelvic fascia and directly vertically.  While hugging the cephalad surface of the pubic bone, the trocar was guided out through the abdomen 2 fingerbreadths lateral to midline at the  level of the pubic symphysis on the ipsilateral side. The trocar was placed on the left side in a similar fashion.  A 70-degree cystoscope was introduced, and 360-degree inspection revealed no trauma or trocars in the bladder, with brisk bilateral ureteral efflux.  The bladder was drained and the cystoscope was removed.  The Foley catheter was reinserted.  The sling was brought to lie beneath the mid-urethra.  A needle driver was placed behind the sling to ensure no tension.    The plastic sheath was removed from the sling and the distal ends of the sling were trimmed just below the level of the skin incisions.  Hemostasis was noted.  Tension-free positioning of the sling was confirmed. Vaginal inspection revealed no vaginotomy or sling perforations of the mucosa. The vaginal mucosal edges were reapproximated using 2-0 Vicryl.  Hemostasis was again noted.  The suprapubic sling incisions were closed with Dermabond. The patient tolerated the procedure well.  She was awakened from anesthesia and transferred to the recovery room in stable condition. All needle and sponge counts were correct x 2.       Marguerita BeardsMichelle N Deoni Cosey, MD

## 2021-05-27 NOTE — Transfer of Care (Signed)
Immediate Anesthesia Transfer of Care Note  Patient: Ashley Morse  Procedure(s) Performed: Procedure(s) (LRB): XI ROBOTIC ASSISTED LAPAROSCOPIC SACROCOLPOPEXY (N/A) TRANSVAGINAL TAPE (TVT) PROCEDURE (N/A) CYSTOSCOPY (N/A)  Patient Location: PACU  Anesthesia Type: General  Level of Consciousness: awake, oriented, sedated and patient cooperative  Airway & Oxygen Therapy: Patient Spontanous Breathing and Patient connected to face mask oxygen  Post-op Assessment: Report given to PACU RN and Post -op Vital signs reviewed and stable  Post vital signs: Reviewed and stable  Complications: Blood noted in left ear at end of prodecure.  Follow up with ENT outpatient.  No anesthesia complications.  Last Vitals:  Vitals Value Taken Time  BP 138/79 05/27/21 1117  Temp    Pulse 87 05/27/21 1126  Resp 10 05/27/21 1126  SpO2 100 % 05/27/21 1126  Vitals shown include unvalidated device data.  Last Pain:  Vitals:   05/27/21 0623  TempSrc: Oral  PainSc: 0-No pain      Patients Stated Pain Goal: 4 (05/27/21 3893)  Complications: No notable events documented.

## 2021-05-27 NOTE — Discharge Instructions (Signed)

## 2021-05-27 NOTE — Progress Notes (Signed)
[employer] contacted for prior approval. PA Not required for all places of service. Ref # [id number] CPT code- 82800 CT Head w/o contrast  Dx code- S09.90 Head Trama

## 2021-05-27 NOTE — Anesthesia Preprocedure Evaluation (Addendum)
Anesthesia Evaluation  Patient identified by MRN, date of birth, ID band Patient awake    Reviewed: Allergy & Precautions, NPO status , Patient's Chart, lab work & pertinent test results  History of Anesthesia Complications Negative for: history of anesthetic complications  Airway Mallampati: III  TM Distance: <3 FB Neck ROM: Full    Dental  (+) Dental Advisory Given, Teeth Intact   Pulmonary neg pulmonary ROS,    breath sounds clear to auscultation       Cardiovascular hypertension, Pt. on medications and Pt. on home beta blockers (-) angina+ CAD and + Past MI   Rhythm:Regular     Neuro/Psych PSYCHIATRIC DISORDERS Anxiety  Neuromuscular disease    GI/Hepatic negative GI ROS, (+) Hepatitis -, C  Endo/Other  Hypothyroidism   Renal/GU negative Renal ROSLab Results      Component                Value               Date                      CREATININE               0.60                05/27/2021                Musculoskeletal  (+) Arthritis ,   Abdominal   Peds  Hematology negative hematology ROS (+) Lab Results      Component                Value               Date                      WBC                      15.0 (H)            07/05/2016                HGB                      13.9                05/27/2021                HCT                      41.0                05/27/2021                MCV                      89.4                07/05/2016                PLT                      246                 07/05/2016              Anesthesia Other Findings   Reproductive/Obstetrics  Anesthesia Physical Anesthesia Plan  ASA: 2  Anesthesia Plan: General   Post-op Pain Management: Tylenol PO (pre-op) and Toradol IV (intra-op)   Induction: Intravenous  PONV Risk Score and Plan: 3 and Ondansetron and Dexamethasone  Airway Management Planned: Oral  ETT  Additional Equipment: None  Intra-op Plan:   Post-operative Plan: Extubation in OR  Informed Consent: I have reviewed the patients History and Physical, chart, labs and discussed the procedure including the risks, benefits and alternatives for the proposed anesthesia with the patient or authorized representative who has indicated his/her understanding and acceptance.     Dental advisory given  Plan Discussed with: CRNA and Anesthesiologist  Anesthesia Plan Comments:         Anesthesia Quick Evaluation

## 2021-05-28 ENCOUNTER — Other Ambulatory Visit: Payer: Self-pay | Admitting: Obstetrics and Gynecology

## 2021-05-28 ENCOUNTER — Encounter (HOSPITAL_BASED_OUTPATIENT_CLINIC_OR_DEPARTMENT_OTHER): Payer: Self-pay | Admitting: *Deleted

## 2021-05-28 ENCOUNTER — Encounter (HOSPITAL_BASED_OUTPATIENT_CLINIC_OR_DEPARTMENT_OTHER): Payer: Self-pay | Admitting: Obstetrics and Gynecology

## 2021-05-28 DIAGNOSIS — H9222 Otorrhagia, left ear: Secondary | ICD-10-CM

## 2021-05-28 DIAGNOSIS — S00432A Contusion of left ear, initial encounter: Secondary | ICD-10-CM | POA: Diagnosis not present

## 2021-05-28 MED ORDER — ACETAMINOPHEN 325 MG PO TABS
ORAL_TABLET | ORAL | Status: AC
  Filled 2021-05-28: qty 2

## 2021-05-28 MED ORDER — ONDANSETRON HCL 4 MG/2ML IJ SOLN
INTRAMUSCULAR | Status: AC
  Filled 2021-05-28: qty 2

## 2021-05-28 MED ORDER — ONDANSETRON HCL 4 MG PO TABS: 4.0000 mg | ORAL_TABLET | Freq: Four times a day (QID) | ORAL | 0 refills | Status: DC | PRN

## 2021-05-28 NOTE — Progress Notes (Addendum)
1 Day Post-Op Procedure(s) (LRB): XI ROBOTIC ASSISTED LAPAROSCOPIC SACROCOLPOPEXY (N/A) TRANSVAGINAL TAPE (TVT) PROCEDURE (N/A) CYSTOSCOPY (N/A)  Subjective: Has CT head yesterday due to history of mild head trauma and ear bleeding post-operatively- it was negative for acute process. Had some nausea yesterday which has resolved with zofran. She has eaten a muffin and half a banana. Has been voiding well, passed voiding trial yesterday. Per nurse, was not able to ambulate well in the hallway, only went a short distance with assistance. Has a walker and cane at home.   Objective: Vitals:   05/28/21 0123 05/28/21 0615  BP: 133/78 (!) 142/76  Pulse: 85 82  Resp: 20 16  Temp: 99.2 F (37.3 C) 99.2 F (37.3 C)  SpO2: 95% 95%     General: alert, cooperative, and no distress HEENT: no active bleeding from left ear Resp: clear to auscultation bilaterally Cardio: regular rate and rhythm, S1, S2 normal, no murmur, click, rub or gallop GI: soft, non-tender; bowel sounds normal; no masses,  no organomegaly and laparoscopic incisions: clean, dry, and intact Extremities: extremities normal, atraumatic, no cyanosis or edema  Assessment: s/p Procedure(s): XI ROBOTIC ASSISTED LAPAROSCOPIC SACROCOLPOPEXY (N/A) TRANSVAGINAL TAPE (TVT) PROCEDURE (N/A) CYSTOSCOPY (N/A): stable  Plan: - regular diet - Encourage ambulation, will use walker at home.  - PO pain medications- oxycodone, ibuprofen and tylenol - will order zofran Rx to take at home - follow up with ENT as outpatient for left ear bleeding, currently stable - Discharge home  LOS: 0 days    [name] 05/28/2021, 8:27 AM

## 2021-06-03 NOTE — Anesthesia Postprocedure Evaluation (Signed)
Anesthesia Post Note  Patient: [name]  Procedure(s) Performed: XI ROBOTIC ASSISTED LAPAROSCOPIC SACROCOLPOPEXY (Abdomen) TRANSVAGINAL TAPE (TVT) PROCEDURE (Vagina ) CYSTOSCOPY (Bladder)     Patient location during evaluation: PACU Anesthesia Type: General Level of consciousness: awake and alert Pain management: pain level controlled Vital Signs Assessment: post-procedure vital signs reviewed and stable Respiratory status: spontaneous breathing, nonlabored ventilation, respiratory function stable and patient connected to nasal cannula oxygen Cardiovascular status: blood pressure returned to baseline and stable Postop Assessment: no apparent nausea or vomiting Anesthetic complications: no Comments: Patient with ear bleed intraop and bruising behind left ear noted in PACU, mental status slow to clear, given h/o head trauma and mental status discussion with surgeon, surgeon ordered head CT which was negative for acute intracranial process, mental status normalized, patient signed out of PACU   No notable events documented.  Last Vitals:  Vitals:   05/28/21 0615 05/28/21 0847  BP: (!) 142/76 137/82  Pulse: 82 86  Resp: 16 18  Temp: 37.3 C 37.2 C  SpO2: 95% 92%    Last Pain:  Vitals:   05/29/21 1043  TempSrc:   PainSc: 0-No pain                 Jahaira Earnhart

## 2021-06-23 ENCOUNTER — Other Ambulatory Visit: Payer: Self-pay | Admitting: Cardiology

## 2021-07-07 NOTE — Progress Notes (Signed)
[facility]  Date of Visit: 07/08/2021  History of Present Illness: [name] is a 71 y.o. female scheduled today for a post-operative visit.   Surgery: Robotic assisted lysis of adhesions, sacrocolpopexy, midurethral sling, cystoscopy on 05/27/21  She passed her postoperative void trial.   Postoperative course has been uncomplicated.   UTI in the last 6 weeks? No  Pain? No  She has returned to her normal activity (except for postop restrictions) Vaginal bulge? No  Stress incontinence: Yes - had a small amount of leakage with cough, still wears a liner Urgency/frequency: No  Urge incontinence: No  Voiding dysfunction: No  Bowel issues: Yes - has been using the miralax, but not taking every day. Has bowel movements 1-2 times per day but still has some straining (tries not to). Eating more fiber as well.   Subjective Success: Do you usually have a bulge or something falling out that you can see or feel in the vaginal area? No  Retreatment Success: Any retreatment with surgery or pessary for any compartment? No    Medications: She has a current medication list which includes the following prescription(s): ascorbic acid, aspirin ec, b complex vitamins, cholecalciferol, diphenhydramine, repatha sureclick, ezetimibe, latanoprost, levothyroxine, magnesium, ondansetron, turmeric, acetaminophen, estradiol, ibuprofen, metoprolol succinate, naproxen sodium, and polyethylene glycol powder.   Allergies: Patient is allergic to benzalkonium chloride, lactose, lactose intolerance (gi), lisinopril, pravastatin, statins, sulfa antibiotics, sulfamethoxazole-trimethoprim, and prednisone.   Physical Exam: BP (!) 162/90    Pulse 69   Abdomen: soft, non-tender, without masses or organomegaly Abdominal and suprapubic Incisions: healing well.  Pelvic Examination: Vagina: Well healed. One small stitch present on the anterior vaginal wall.  No tenderness along the anterior or posterior vagina.  No apical tenderness. No pelvic masses. No visible or palpable mesh.  POP-Q: POP-Q  -3                                            Aa   -3                                           Ba  -10                                              C   3.5                                            Gh  4                                            Pb  10                                            tvl   -3  Ap  -3                                            Bp                                                 D    ---------------------------------------------------------  Assessment and Plan:  1. Post-operative state     - can restart estrogen cream twice a week - take miralax as needed, can take it daily if needed - still has ovaries so will still need annual pelvic exams.  - She has access to her operative report through my chart - Can resume regular activity including exercise and intercourse,  if desired.  - Discussed avoidance of heavy lifting and straining long term to reduce the risk of recurrence.   Follow up as needed  [name], MD

## 2021-07-08 ENCOUNTER — Ambulatory Visit (INDEPENDENT_AMBULATORY_CARE_PROVIDER_SITE_OTHER): Payer: Medicare Other | Admitting: Obstetrics and Gynecology

## 2021-07-08 ENCOUNTER — Other Ambulatory Visit: Payer: Self-pay

## 2021-07-08 ENCOUNTER — Encounter: Payer: Self-pay | Admitting: Obstetrics and Gynecology

## 2021-07-08 VITALS — BP 162/90 | HR 69

## 2021-07-08 DIAGNOSIS — Z9889 Other specified postprocedural states: Secondary | ICD-10-CM

## 2021-07-22 ENCOUNTER — Encounter: Payer: Self-pay | Admitting: Obstetrics and Gynecology

## 2021-08-06 ENCOUNTER — Encounter: Payer: Self-pay | Admitting: Cardiology

## 2021-08-06 NOTE — Telephone Encounter (Signed)
Carotid Dopplers looked fine.  There is no evidence of any narrowing.  It should not be any concern for the chiropractor from vascular standpoint. ? ?Bryan Lemma, MD ? ?

## 2021-10-05 ENCOUNTER — Other Ambulatory Visit: Payer: Self-pay | Admitting: Cardiology

## 2021-10-07 ENCOUNTER — Encounter: Payer: Self-pay | Admitting: Cardiology

## 2021-10-07 ENCOUNTER — Ambulatory Visit (INDEPENDENT_AMBULATORY_CARE_PROVIDER_SITE_OTHER): Payer: Medicare Other | Admitting: Cardiology

## 2021-10-07 DIAGNOSIS — I471 Supraventricular tachycardia: Secondary | ICD-10-CM

## 2021-10-07 DIAGNOSIS — Z955 Presence of coronary angioplasty implant and graft: Secondary | ICD-10-CM

## 2021-10-07 DIAGNOSIS — I2510 Atherosclerotic heart disease of native coronary artery without angina pectoris: Secondary | ICD-10-CM

## 2021-10-07 DIAGNOSIS — E785 Hyperlipidemia, unspecified: Secondary | ICD-10-CM

## 2021-10-07 DIAGNOSIS — I10 Essential (primary) hypertension: Secondary | ICD-10-CM

## 2021-10-07 DIAGNOSIS — I252 Old myocardial infarction: Secondary | ICD-10-CM | POA: Diagnosis not present

## 2021-10-07 DIAGNOSIS — R000 Tachycardia, unspecified: Secondary | ICD-10-CM

## 2021-10-07 DIAGNOSIS — I872 Venous insufficiency (chronic) (peripheral): Secondary | ICD-10-CM

## 2021-10-07 MED ORDER — REPATHA SURECLICK 140 MG/ML ~~LOC~~ SOAJ: SUBCUTANEOUS | 3 refills | Status: DC

## 2021-10-07 NOTE — Progress Notes (Signed)
Primary Care Provider: Richmond Campbell., PA-C Cardiologist: Bryan Lemma, MD Electrophysiologist: None  Clinic Note: Chief Complaint  Patient presents with   Follow-up    No major issues.  No cardiac issues.   Coronary Artery Disease    Has not had angina since he has been living in [place].    ===================================  ASSESSMENT/PLAN   Problem List Items Addressed This Visit       Cardiology Problems   Coronary artery disease involving native coronary artery of native heart without angina pectoris (Chronic)    History of inferior STEMI back in 2010.  Had 2 overlapping stents in the PDA _>   Remains on aspirin monotherapy along with low-dose Toprol. Statin intolerant-therefore on Repatha. Has had relatively good blood pressure control on Toprol and therefore had not required ARB.  Losartan was then started and subsequently removed.      Relevant Medications   Evolocumab (REPATHA SURECLICK) 140 MG/ML SOAJ   Venous insufficiency of both lower extremities (Chronic)    Pretty stable edema.  She does wear support stockings occasionally, but not during the summertime.  When she travels she wears them.  Continue to elevate feet.  No further ablation procedures.  She has not had any issues with wounds or venous stasis dermatitis changes.      Relevant Medications   Evolocumab (REPATHA SURECLICK) 140 MG/ML SOAJ   Hyperlipidemia with target LDL less than 70; intolerant of statin in the past due to elevated LFTs (Chronic)    Lipids fairly well controlled on Repatha and Zetia.  Was not even able to tolerate low-dose pravastatin. Labs were last checked in November 2022, probably okay to wait till November to this year.      Relevant Medications   Evolocumab (REPATHA SURECLICK) 140 MG/ML SOAJ   Essential hypertension (Chronic)    BP little elevated today which is unusual for her.  She tells me that is not usually the case.  She is on low-dose Toprol along.   She had been on losartan which is no longer listed.        Relevant Medications   Evolocumab (REPATHA SURECLICK) 140 MG/ML SOAJ   PSVT (paroxysmal supraventricular tachycardia) (HCC) (Chronic)    Episodes seem to be pretty well controlled short-lived spells every now and then for which she takes her Toprol daily.  If she has a bad spell she can take an extra dose of Toprol. Continue with adequate hydration. Discussed vagal maneuvers.      Relevant Medications   Evolocumab (REPATHA SURECLICK) 140 MG/ML SOAJ     Other   History of acute inferior wall MI (Chronic)    Distant history of inferior STEMI with occluded PDA.  LV gram showed inferior hypokinesis but not seen on postop echo.  Normal EF of 60% with no wall motion abnormality  No recurrent anginal symptoms or heart failure symptoms since PCI, as such, she has not had any further evaluation with either echocardiogram or ischemic evaluation with Myoview.      Rapid heart beat    She continues to follow-up with a home Kardia event monitor;  Overall pretty well controlled on beta-blocker.      Presence of drug-eluting stent in right coronary artery (Chronic)    Distant history of PCI.  On aspirin alone. Okay to hold aspirin 5 to 7 days preop for surgeries or procedures.       ===================================  HPI:    Ashley Morse is a 71 y.o. female  with a PMH notable for CAD-PCI as well as Chronic Bilateral Lower Edema (related to significant varicose veins and venous reflux-status post multiple ablations) who presents today for at the request of Richmond CampbellKaplan, Ashley W., PA-C.  [name] was most recently seen by Bailey MechKatherine Lawrence, NP on 04/04/2021 for preop evaluation for urogynecologic procedure.  Prior to that I had last seen her in September 2021 and she was doing well.  She was intolerant of statin and was started on Nexletol with plans to refer to CVRR clinic => was eventually started on Repatha.=> At follow-up  visit, she was doing well with no chest pain or palpitations.  No CHF symptoms.  Medically compliant. Felt to be low risk for surgical procedure.  "Cleared for surgery and told to hold aspirin 5 days preop.  Recent Hospitalizations:  Robotic Assisted Laparoscopic Sacrocolpopexy  Reviewed  CV studies:    The following studies were reviewed today: (if available, images/films reviewed: From Epic Chart or Care Everywhere) None:  Interval History:   Darien RamusNancy Morse  Returns for delayed follow-up overall doing pretty well.  She remains relatively asymptomatic.  Tolerating Repatha and Zetia without any issues.  No major bleeding on aspirin.  She says that her blood pressures are much better controlled at home that they are here today.  Usually they are managing the 110-120/70 mmHg range. She notes occasional off-and-on rapid heart rate spells usually when she is at rest.  Sometimes she still has elevated heart rates that occur when she is trying to recover from her exercise.  These episodes may happen once or twice every 3 to 4 months. Otherwise, she denies any chest pain pressure or dyspnea with rest or exertion.  Tries to stay relatively active => She tries to get at least somewhere between 5-10,000 steps a day wearing her Fitbit. No real change in her lower extremity edema.  CV Review of Symptoms (Summary) Cardiovascular ROS: no chest pain or dyspnea on exertion positive for - edema, palpitations, rapid heart rate, and these are noted above. negative for - irregular heartbeat, orthopnea, paroxysmal nocturnal dyspnea, shortness of breath, or lightheadedness, dizziness or wooziness, syncope/near syncope or TIA/amaurosis fugax, claudication  REVIEWED OF SYSTEMS   Review of Systems  Constitutional:  Negative for malaise/fatigue and weight loss.  HENT:  Negative for congestion and sinus pain.   Respiratory:  Negative for cough and shortness of breath.   Cardiovascular:        Per HPI   Gastrointestinal:  Negative for blood in stool, constipation and melena.  Genitourinary:  Negative for dysuria, frequency, hematuria and urgency.  Musculoskeletal:  Positive for joint pain. Negative for back pain, falls and myalgias.  Neurological:  Negative for dizziness and headaches.  Psychiatric/Behavioral:  Negative for depression and memory loss. The patient is not nervous/anxious and does not have insomnia.    I have reviewed and (if needed) personally updated the patient's problem list, medications, allergies, past medical and surgical history, social and family history.   PAST MEDICAL HISTORY   Past Medical History:  Diagnosis Date   Bilateral venous insufficiency    Bilateral lower extremity edema,significant varicose vein; s/p vein stripping and venous abaltionof greater saph. vein  --> proved to be unsuccessful 2/2 vessel size.; Occasionally wears compression stockings during the wintertime.  cannot tolerate during the summertime;    CAD S/P percutaneous coronary angioplasty 09/15/2008   Distal RPDA: 2 overlapping Promus and DES 2.5 mm x 15 mm, 2.5 mm 23 mm distal to mid RCA.  EF 50%. ([place], [place])    Complication of anesthesia    Pt states that many yrs ago after having a bunionectomy she experienced severe N & V. She is uncertain if the N & V was from the anesthesia or the antibiotic that she was given. She has not had any problems since with anesthesia. per 05/21/21 phone call w/patient   Complication of anesthesia    pt states she had some bleeding after partial hysterectomy 1975, no blood transfusion requiredper pt on phone call 05-22-2021   COVID 02/2021   mild symptoms - no treatment   Family history of adverse reaction to anesthesia    pt denied a family hx of anesthesia complications per 05/21/21 phone call   Heart murmur    mild per pt   Hemorrhoids    Hepatitis    Pt states that the blood bank told her that she could no longer give blood due to Hep C. Pt had  a liver bx >10 years ago. She states that she was never clear on whether she has Hep C. She has not taken any treatment as of 05/21/21.   Hepatitis C    History of blood transfusion    1971   History of: ST elevation myocardial infarction (STEMI) of inferior wall, subsequent episode of care 09/15/2008   R- PDA occlusion --> PCI with DES   Hyperlipidemia    on simvastatin stopped due to increase liver enzyme rechecked back to normal   Hypertension    Hypothyroidism    On levothyroxine   Osteoarthritis    Presence of drug coated stent in right coronary artery 09/15/2008   See above; no longer on DAPT.   Varicose veins     PAST SURGICAL HISTORY   Past Surgical History:  Procedure Laterality Date   ABDOMINAL HYSTERECTOMY     around 2004   bilateral bunion surgery     2014 or 2015 both feet   BLADDER SUSPENSION N/A 05/27/2021   Procedure: TRANSVAGINAL TAPE (TVT) PROCEDURE;  Surgeon: [name], MD;  [facility];  Service: Gynecology;  Laterality: N/A;   BUNIONECTOMY     CARDIAC CATHETERIZATION  10/23/2008   in [place]- for inferior STEMI, occluded RPDA   COLONOSCOPY  04/15/2015   Dr. Lupe Carney   CORONARY ANGIOPLASTY WITH STENT PLACEMENT  10/23/2008   occulsion of RPDA  -- 2 Promus DES stent overlapping ;2.5 x 15 and 2.5 x 23 mm stents from distal RCA to PDA, ef50%   CYSTOCELE REPAIR     around 2004   CYSTOSCOPY N/A 05/27/2021   Procedure: CYSTOSCOPY;  Surgeon: [name], MD;  Location: [facility];  Service: Gynecology;  Laterality: N/A;   ENDOVENOUS ABLATION SAPHENOUS VEIN W/ LASER Left 08/01/2015   endovenous laser ablation left greater saphenous vein by Gretta Began MD   ENDOVENOUS ABLATION SAPHENOUS VEIN W/ LASER Right 08/15/2015   endovenous laser ablation right greater saphenous vein by Gretta Began MD   left venous  duplex Left 08/31/2011   Negative for DVT   LIVER BIOPSY     around 2000, pt unsure of date x 2    Radiofrequency Ablation - Left Greater Saphenous Vein: Unsuccessful Left 08/31/2011   ROBOTIC ASSISTED LAPAROSCOPIC SACROCOLPOPEXY N/A 05/27/2021   Procedure: XI ROBOTIC ASSISTED LAPAROSCOPIC SACROCOLPOPEXY;  Surgeon: [name], MD;  Location[facility];  Service: Gynecology;  Laterality: N/A;   TRANSTHORACIC ECHOCARDIOGRAM  10/2008    Echo,  May 2010 - following PCI for inferior STEMI. No wall motion abnormalities, EF of 60%   VARICOSE VEIN SURGERY Right    WISDOM TOOTH EXTRACTION      Immunization History  Administered Date(s) Administered   PFIZER Comirnaty(Gray Top)Covid-19 Tri-Sucrose Vaccine 12/02/2020   PFIZER(Purple Top)SARS-COV-2 Vaccination 06/22/2019, 07/17/2019    MEDICATIONS/ALLERGIES   Current Meds  Medication Sig   acetaminophen (TYLENOL) 500 MG tablet Take 1 tablet (500 mg total) by mouth every 6 (six) hours as needed (pain).   ascorbic acid (VITAMIN C) 100 MG tablet    aspirin EC 81 MG tablet Take 81 mg by mouth daily.   b complex vitamins tablet Take 1 tablet by mouth daily.   cholecalciferol (VITAMIN D) 25 MCG (1000 UT) tablet Take 1 tablet by mouth daily.    estradiol (ESTRACE) 0.1 MG/GM vaginal cream    ezetimibe (ZETIA) 10 MG tablet TAKE 1 TABLET DAILY (SCHEDULE AN APPOINTMENT FOR FUTURE REFILLS)   latanoprost (XALATAN) 0.005 % ophthalmic solution 1 drop at bedtime.   levothyroxine (SYNTHROID, LEVOTHROID) 88 MCG tablet Take 88 mcg by mouth daily before breakfast.   Magnesium 400 MG CAPS Take by mouth daily.   metoprolol succinate (TOPROL-XL) 25 MG 24 hr tablet TAKE 1 TABLET DAILY   naproxen sodium (ALEVE) 220 MG tablet Take 220 mg by mouth daily as needed. PRN   polyethylene glycol powder (GLYCOLAX/MIRALAX) 17 GM/SCOOP powder Take 17 g by mouth daily. Drink 17g (1 scoop) dissolved in water per day.   REPATHA SURECLICK 140 MG/ML SOAJ INJECT 140 MG UNDER THE SKIN EVERY 14 DAYS   TURMERIC PO Take 800 mg by mouth daily.    Allergies   Allergen Reactions   Benzalkonium Chloride Other (See Comments)    unknown   Lactose Other (See Comments)    unknown   Lactose Intolerance (Gi)    Lisinopril Cough    unknown   Pravastatin     Elevated LFTs   Statins     Atorvastatin,simvastain. Rosuvastatin    Sulfa Antibiotics Swelling    Skin discoloration   Sulfamethoxazole-Trimethoprim     Other reaction(s): Other (See Comments) unknown   Prednisone Rash    Rash/swelling    SOCIAL HISTORY/FAMILY HISTORY   Reviewed in Epic:  Pertinent findings:  Social History   Tobacco Use   Smoking status: Never   Smokeless tobacco: Never  Vaping Use   Vaping Use: Never used  Substance Use Topics   Alcohol use: Yes    Alcohol/week: 1.0 standard drink of alcohol    Types: 1 Glasses of wine per week    Comment: Occasionally   Drug use: Never   Social History   Social History Narrative   She is a married, mother of one living offspring with one child who died at young age.    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    She is starting to "embrace retirement" & i8 enjoying her free time.      She starting beekeeping and other activities.       She still trying to exercise the gym 2-3 times a week but now is up to 3-4 times a week in addition to doing walking.       She drinks occasional glass of wine, but if she drinks more not bothered her hemorrhoids.   She does not smoke.   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  OBJCTIVE -PE, EKG, labs   Wt Readings from Last 3 Encounters:  10/07/21 162 lb 3.2 oz (73.6 kg)  05/27/21 158 lb 3.2 oz (71.8 kg)  04/04/21 160 lb (72.6 kg)    Physical Exam: BP (!) 146/82   Pulse 67   Ht 5\' 4"  (1.626 m)   Wt 162 lb 3.2 oz (73.6 kg)   SpO2 98%   BMI 27.84 kg/m  Physical Exam Vitals reviewed.  Constitutional:      General: She is not in acute distress.    Appearance: Normal appearance. She is normal weight.  She is not ill-appearing or toxic-appearing.     Comments: Well-nourished, well-groomed.  Healthy-appearing.  HENT:     Head: Normocephalic and atraumatic.  Neck:     Vascular: No carotid bruit.  Cardiovascular:     Rate and Rhythm: Normal rate and regular rhythm.     Pulses: Normal pulses.     Heart sounds: Normal heart sounds. No murmur heard.    No friction rub. No gallop.  Pulmonary:     Effort: Pulmonary effort is normal. No respiratory distress.     Breath sounds: Normal breath sounds. No wheezing, rhonchi or rales.  Chest:     Chest wall: No tenderness.  Musculoskeletal:        General: Swelling (Prominent swollen varicose veins bilateral legs more so than true edema.) present. Normal range of motion.     Cervical back: Normal range of motion and neck supple.     Right lower leg: Edema present.     Left lower leg: Edema present.  Skin:    Comments: Diffuse spider veins/small varicose veins in the ankles and feet on both legs.  Less prominent spider veins and more varicose veins in the lower legs to mid thighs.  Minimal true venous stasis change.  Neurological:     General: No focal deficit present.     Mental Status: She is alert and oriented to person, place, and time.     Cranial Nerves: No cranial nerve deficit.     Motor: No weakness.     Gait: Gait normal.  Psychiatric:        Mood and Affect: Mood normal.        Behavior: Behavior normal.        Thought Content: Thought content normal.        Judgment: Judgment normal.     Adult ECG Report N/a  Recent Labs: Reviewed Lab Results  Component Value Date   CHOL 105 04/02/2021   HDL 64 04/02/2021   LDLCALC 28 04/02/2021   TRIG 56 04/02/2021   CHOLHDL 1.6 04/02/2021   Lab Results  Component Value Date   CREATININE 0.60 05/27/2021   BUN 16 05/27/2021   NA 141 05/27/2021   K 4.6 05/27/2021   CL 104 05/27/2021   CO2 26 06/27/2020      Latest Ref Rng & Units 05/27/2021    6:27 AM 07/05/2016    8:57 AM  CBC   WBC 4.0 - 10.5 K/uL  15.0   Hemoglobin 12.0 - 15.0 g/dL 07/07/2016  71.2   Hematocrit 36.0 - 46.0 % 41.0  42.2   Platelets 150 - 400 K/uL  246     No results found for: "HGBA1C" No results found for: "TSH"  ==================================================  COVID-19 Education: The signs and symptoms of COVID-19 were discussed with the patient and how to seek care for testing (follow up with PCP or arrange E-visit).  I spent a total of 17 minutes with the patient spent in direct patient consultation.  Additional time spent with chart review  / charting (studies, outside notes, etc): 14 min Total Time: 31 min  Current medicines are reviewed at length with the patient today.  (+/- concerns) N/A  This visit occurred during the SARS-CoV-2 public health emergency.  Safety protocols were in place, including screening questions prior to the visit, additional usage of staff PPE, and extensive cleaning of exam room while observing appropriate contact time as indicated for disinfecting solutions.  Notice: This dictation was prepared with Dragon dictation along with smart phrase technology. Any transcriptional errors that result from this process are unintentional and may not be corrected upon review.  Studies Ordered:   No orders of the defined types were placed in this encounter.  Meds ordered this encounter  Medications   Evolocumab (REPATHA SURECLICK) 140 MG/ML SOAJ    Sig: INJECT 140 MG UNDER THE SKIN EVERY 14 DAYS    Dispense:  6 mL    Refill:  3    Patient Instructions / Medication Changes & Studies & Tests Ordered   Patient Instructions  Medication Instructions:   No changes   Will have lipid clinic contact you about renewal of your repathat  *If you need a refill on your cardiac medications before your next appointment, please call your pharmacy*   Lab Work:  Not needed   Testing/Procedures: Not needed   Follow-Up: At [facility], you and your health needs are  our priority.  As part of our continuing mission to provide you with exceptional heart care, we have created designated Provider Care Teams.  These Care Teams include your primary Cardiologist (physician) and Advanced Practice Providers (APPs -  Physician Assistants and Nurse Practitioners) who all work together to provide you with the care you need, when you need it.     Your next appointment:   12 month(s)  The format for your next appointment:   In Person  Provider:   Bryan Lemma, MD    Other Instructions      Bryan Lemma, M.D., M.S. Interventional Cardiologist   Pager # [phone] Phone # [phone] [address] [place], [place] [address]   Thank you for choosing Heart[facility] at [facility]!!

## 2021-10-07 NOTE — Patient Instructions (Signed)
Medication Instructions:   No changes   Will have lipid clinic contact you about renewal of your repathat  *If you need a refill on your cardiac medications before your next appointment, please call your pharmacy*   Lab Work:  Not needed   Testing/Procedures: Not needed   Follow-Up: At [facility], you and your health needs are our priority.  As part of our continuing mission to provide you with exceptional heart care, we have created designated Provider Care Teams.  These Care Teams include your primary Cardiologist (physician) and Advanced Practice Providers (APPs -  Physician Assistants and Nurse Practitioners) who all work together to provide you with the care you need, when you need it.     Your next appointment:   12 month(s)  The format for your next appointment:   In Person  Provider:   Glenetta Hew, MD    Other Instructions

## 2021-10-15 ENCOUNTER — Encounter: Payer: Self-pay | Admitting: Cardiology

## 2021-10-20 ENCOUNTER — Ambulatory Visit (INDEPENDENT_AMBULATORY_CARE_PROVIDER_SITE_OTHER): Payer: Medicare Other | Admitting: Pharmacist

## 2021-10-20 DIAGNOSIS — E785 Hyperlipidemia, unspecified: Secondary | ICD-10-CM | POA: Diagnosis not present

## 2021-10-20 NOTE — Progress Notes (Signed)
Patient ID: Ashley Morse                 DOB: February 04, 1951                    MRN: [id number]     HPI: Ashley Morse is a 71 y.o. female patient referred to lipid clinic by Dr. Ellyn Hack. PMH is significant for bilateral venous insufficiency, CAD, STEMI in 2010 s/p stenting to the RCA, HTN, HLD, PSVT, and hypothyroidism. Patient has an history of statin intolerance with rosuvastatin and atorvastatin.  Was able to tolerate pravastatin but experienced a mild increase in LFTs of AST to 67 and ALT to 53 and was instructed to discontinue. Follow-up AST/ALT improved slightly. Pravastatin was resumed, LFTs increased slightly, and she was advised to discontinue. Nexletol was prescribed, however, PA was denied. Repatha was started by PharmD 09/2020 and she reports tolerating it well. She is presenting today for lipid therapy follow-up.  Dr. Ellyn Hack spoke with the patient on 10/07/21 and pt expressed interest in reducing the number of lipid lowering medications being given. At that time, pt expressed interest in transitioning to once monthly injections. Upon arrival today, pt mentioned that she recently received a 3 months supply of Repatha and is tolerating it well except for local redness around injection site lasting 2-3 days. PharmD educated on administration of once monthly Repatha after which the pt preferred to continue twice monthly injections. Discussed that once monthly formulation is not really "less" medication, just a higher once monthly formulation that is just as effective.  Current Medications: Zetia 10 mg daily and Repatha 140 mg every 2 weeks Intolerances: pravastatin 10 mg daily (elevated LFTs) and atorvastatin (myalgias) and rosuvastatin 5 mg every MWF (myalgias) Risk Factors: ASCVD, HTN, HLD LDL goal: <55 given RF of ASCVD + age and HTN  Diet: Reports following heart healthy diet the majority of the time.  Exercise: Not discussed this visit  Family History: Mother with heart disease,  stroke, and varicose veins, sister with diabetes, and daughter with lymphoma  Social History: Never smoker  Labs: Lipid panel 04/02/21 on Repatha 140 mg and Zetia 10 mg: TC 105, TG 56,HDL 64, VLDL 28, LDL 28  Hepatic panel 04/02/21 on Repatha 140 mg and Zetia 10 mg: Tbili 0.4, Dbili 0.14, Alk phos 74, AST 64, ALT 48  Past Medical History:  Diagnosis Date   Bilateral venous insufficiency    Bilateral lower extremity edema,significant varicose vein; s/p vein stripping and venous abaltionof greater saph. vein  --> proved to be unsuccessful 2/2 vessel size.; Occasionally wears compression stockings during the wintertime.  cannot tolerate during the summertime;    CAD S/P percutaneous coronary angioplasty 09/15/2008   Distal RPDA: 2 overlapping Promus and DES 2.5 mm x 15 mm, 2.5 mm 23 mm distal to mid RCA. EF 50%. ([place], [place])    Complication of anesthesia    Pt states that many yrs ago after having a bunionectomy she experienced severe N & V. She is uncertain if the N & V was from the anesthesia or the antibiotic that she was given. She has not had any problems since with anesthesia. per 05/21/21 phone call w/patient   Complication of anesthesia    pt states she had some bleeding after partial hysterectomy 1975, no blood transfusion requiredper pt on phone call 05-22-2021   COVID 02/2021   mild symptoms - no treatment   Family history of adverse reaction to anesthesia    pt denied  a family hx of anesthesia complications per 09/17/64 phone call   Heart murmur    mild per pt   Hemorrhoids    Hepatitis    Pt states that the blood bank told her that she could no longer give blood due to Hep C. Pt had a liver bx >10 years ago. She states that she was never clear on whether she has Hep C. She has not taken any treatment as of 05/21/21.   Hepatitis C    History of blood transfusion    1971   History of: ST elevation myocardial infarction (STEMI) of inferior wall, subsequent episode of care  09/15/2008   R- PDA occlusion --> PCI with DES   Hyperlipidemia    on simvastatin stopped due to increase liver enzyme rechecked back to normal   Hypertension    Hypothyroidism    On levothyroxine   Osteoarthritis    Presence of drug coated stent in right coronary artery 09/15/2008   See above; no longer on DAPT.   Varicose veins     Current Outpatient Medications on File Prior to Visit  Medication Sig Dispense Refill   acetaminophen (TYLENOL) 500 MG tablet Take 1 tablet (500 mg total) by mouth every 6 (six) hours as needed (pain). 30 tablet 0   ascorbic acid (VITAMIN C) 100 MG tablet      aspirin EC 81 MG tablet Take 81 mg by mouth daily.     b complex vitamins tablet Take 1 tablet by mouth daily.     cholecalciferol (VITAMIN D) 25 MCG (1000 UT) tablet Take 1 tablet by mouth daily.      estradiol (ESTRACE) 0.1 MG/GM vaginal cream      Evolocumab (REPATHA SURECLICK) 440 MG/ML SOAJ INJECT 140 MG UNDER THE SKIN EVERY 14 DAYS 6 mL 3   ezetimibe (ZETIA) 10 MG tablet TAKE 1 TABLET DAILY (SCHEDULE AN APPOINTMENT FOR FUTURE REFILLS) 90 tablet 3   latanoprost (XALATAN) 0.005 % ophthalmic solution 1 drop at bedtime.     levothyroxine (SYNTHROID, LEVOTHROID) 88 MCG tablet Take 88 mcg by mouth daily before breakfast.     Magnesium 400 MG CAPS Take by mouth daily.     metoprolol succinate (TOPROL-XL) 25 MG 24 hr tablet TAKE 1 TABLET DAILY 90 tablet 3   naproxen sodium (ALEVE) 220 MG tablet Take 220 mg by mouth daily as needed. PRN     polyethylene glycol powder (GLYCOLAX/MIRALAX) 17 GM/SCOOP powder Take 17 g by mouth daily. Drink 17g (1 scoop) dissolved in water per day. 255 g 0   TURMERIC PO Take 800 mg by mouth daily.     No current facility-administered medications on file prior to visit.    Allergies  Allergen Reactions   Benzalkonium Chloride Other (See Comments)    unknown   Lactose Other (See Comments)    unknown   Lactose Intolerance (Gi)    Lisinopril Cough    unknown    Pravastatin     Elevated LFTs   Statins     Atorvastatin,simvastain. Rosuvastatin    Sulfa Antibiotics Swelling    Skin discoloration   Sulfamethoxazole-Trimethoprim     Other reaction(s): Other (See Comments) unknown   Prednisone Rash    Rash/swelling    Assessment/Plan:  1. Hyperlipidemia -  LDL of 28 at goal of <55 given ASCVD with age >35 and HTN on Repatha and Zetia. Given LDL is consistently at goal and patient expresses a desire to reduce the number of daily medications,  will stop ezetimibe today. Continue Repatha 140 mg every other week. Sees PCP in 6 weeks who can recheck lipids. F/u with PharmD as needed.  Pt seen with Reatha Harps, PGY1 pharmacy resident  [place] Supple, PharmD, BCACP, [place] [address], [place], [place] [address] Phone: [phone]; Fax: [phone] 10/20/2021 10:18 AM

## 2021-10-20 NOTE — Patient Instructions (Signed)
It was nice to see you today  Your cholesterol looks excellent  You can stop taking your Zetia (ezetimibe)  Continue taking your Repatha injections every 2 weeks for your cholesterol  Primary care can recheck your cholesterol in 6 weeks or after

## 2021-11-17 ENCOUNTER — Encounter: Payer: Self-pay | Admitting: Cardiology

## 2021-11-17 NOTE — Assessment & Plan Note (Signed)
Episodes seem to be pretty well controlled short-lived spells every now and then for which she takes her Toprol daily.  If she has a bad spell she can take an extra dose of Toprol. Continue with adequate hydration. Discussed vagal maneuvers.

## 2021-11-17 NOTE — Assessment & Plan Note (Signed)
Distant history of inferior STEMI with occluded PDA.  LV gram showed inferior hypokinesis but not seen on postop echo.  Normal EF of 60% with no wall motion abnormality  No recurrent anginal symptoms or heart failure symptoms since PCI, as such, she has not had any further evaluation with either echocardiogram or ischemic evaluation with Myoview.

## 2021-11-17 NOTE — Assessment & Plan Note (Addendum)
History of inferior STEMI back in 2010.  Had 2 overlapping stents in the PDA _>   Remains on aspirin monotherapy along with low-dose Toprol. Statin intolerant-therefore on Repatha. Has had relatively good blood pressure control on Toprol and therefore had not required ARB.  Losartan was then started and subsequently removed.

## 2021-11-17 NOTE — Assessment & Plan Note (Signed)
She continues to follow-up with a home Kardia event monitor;  Overall pretty well controlled on beta-blocker.

## 2021-11-17 NOTE — Assessment & Plan Note (Signed)
Distant history of PCI.  On aspirin alone.  Okay to hold aspirin 5 to 7 days preop for surgeries or procedures.

## 2021-11-17 NOTE — Assessment & Plan Note (Addendum)
Lipids fairly well controlled on Repatha and Zetia.  Was not even able to tolerate low-dose pravastatin. Labs were last checked in November 2022, probably okay to wait till November to this year.

## 2021-11-17 NOTE — Assessment & Plan Note (Addendum)
Pretty stable edema.  She does wear support stockings occasionally, but not during the summertime.  When she travels she wears them.  Continue to elevate feet.  No further ablation procedures.  She has not had any issues with wounds or venous stasis dermatitis changes.

## 2021-11-17 NOTE — Assessment & Plan Note (Signed)
BP little elevated today which is unusual for her.  She tells me that is not usually the case.  She is on low-dose Toprol along.  She had been on losartan which is no longer listed.

## 2022-01-15 ENCOUNTER — Telehealth: Payer: Self-pay | Admitting: *Deleted

## 2022-01-15 ENCOUNTER — Encounter: Payer: Self-pay | Admitting: Cardiology

## 2022-01-15 DIAGNOSIS — I2510 Atherosclerotic heart disease of native coronary artery without angina pectoris: Secondary | ICD-10-CM

## 2022-01-15 DIAGNOSIS — E785 Hyperlipidemia, unspecified: Secondary | ICD-10-CM

## 2022-01-15 NOTE — Telephone Encounter (Signed)
   Pre-operative Risk Assessment    Patient Name: Ashley Morse  DOB: [birth year] MRN: [id number]      Request for Surgical Clearance    Procedure:   COLONOSCOPY  Date of Surgery:  Clearance 01/20/22                                 Surgeon:  DR. HUNG Surgeon's Group or Practice Name:  [facility] Phone number:  [phone] Fax number:  [phone]   Type of Clearance Requested:   - Medical ; NO MEDICATIONS LISTED AS NEEDING TO BE HELD   Type of Anesthesia:   PROPOFOL   Additional requests/questions:    Elpidio Anis   01/15/2022, 3:33 PM

## 2022-01-16 ENCOUNTER — Telehealth: Payer: Self-pay | Admitting: Pharmacist

## 2022-01-16 MED ORDER — REPATHA SURECLICK 140 MG/ML ~~LOC~~ SOAJ: SUBCUTANEOUS | 3 refills | Status: DC

## 2022-01-16 NOTE — Telephone Encounter (Signed)
   Name: Ashley Morse  DOB: 11-21-1950  MRN: [id number]   Primary Cardiologist: Bryan Lemma, MD  Chart reviewed as part of pre-operative protocol coverage. Patient was contacted 01/16/2022 in reference to pre-operative risk assessment for pending surgery as outlined below.  Ameenah Prosser was last seen on 10/07/2021 by Dr. Herbie Baltimore.  Since that day, Mahoganie Basher has done well without exertional chest pain or worsening dyspnea.  If needed, patient may hold aspirin for 5 days prior to the procedure and restart as soon as possible afterward at the surgeon's discretion.  Therefore, based on ACC/AHA guidelines, the patient would be at acceptable risk for the planned procedure without further cardiovascular testing.   The patient was advised that if she develops new symptoms prior to surgery to contact our office to arrange for a follow-up visit, and she verbalized understanding.  I will route this recommendation to the requesting party via Epic fax function and remove from pre-op pool. Please call with questions.  Alcan Border, [place] 01/16/2022, 1:38 PM

## 2022-01-16 NOTE — Telephone Encounter (Signed)
PA for Repatha submitted to [employer]. Key: [id number].  PA approved until further notice.

## 2022-02-07 ENCOUNTER — Other Ambulatory Visit: Payer: Self-pay | Admitting: Cardiology

## 2022-02-07 DIAGNOSIS — I471 Supraventricular tachycardia: Secondary | ICD-10-CM

## 2022-02-18 IMAGING — CT CT HEAD W/O CM
4 series · 16 of 47 positions shown, 18 images · non-contrast
Comparison: None.

CLINICAL DATA: Facial trauma, blunt Hit head recently (right
frontal). After robotic surgery, noted bleeding from left ear and
bruising behind the ear

EXAM:
CT HEAD WITHOUT CONTRAST
TECHNIQUE: Contiguous axial images were obtained from the base of the skull
through the vertex without intravenous contrast.

[Series 2: head wo · axial · 0.47mm/px · z∈[+1368,+1488]mm · 7 of 32 slices shown, 9 images]
[im 4/32  brain]
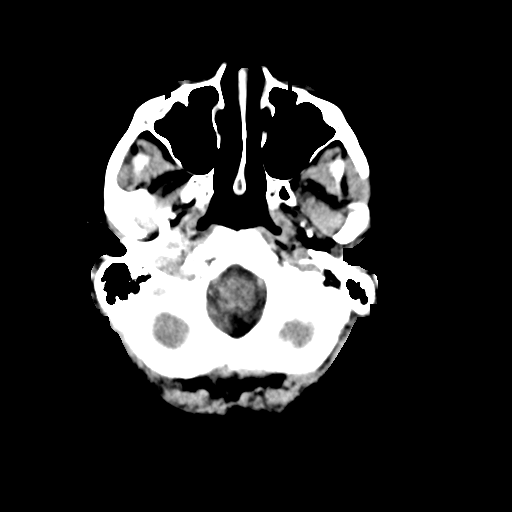
[im 4/32  bone]
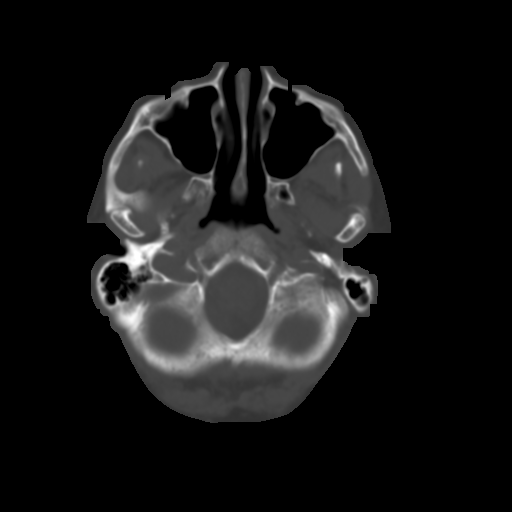
[im 8/32  brain]
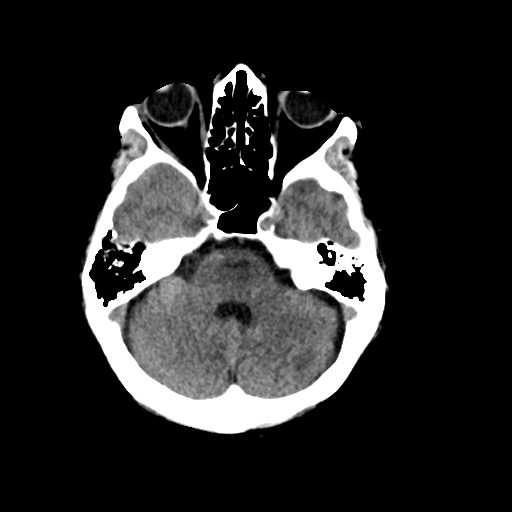
[im 12/32  brain]
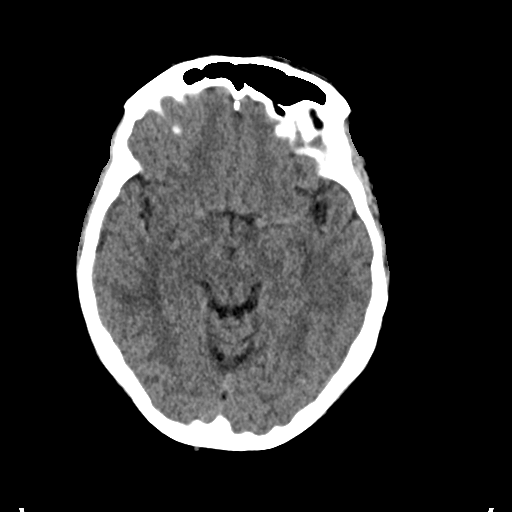
[im 16/32  brain]
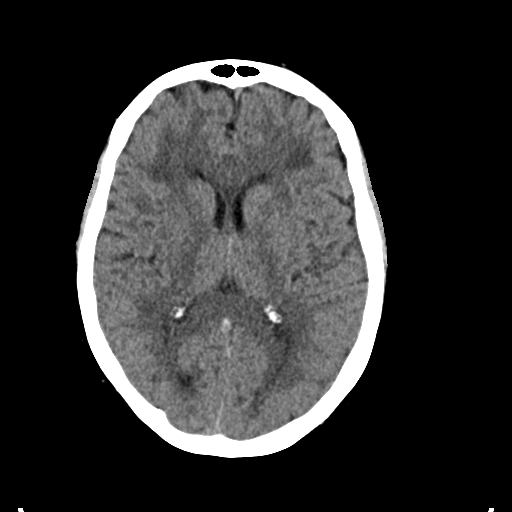
[im 20/32  brain]
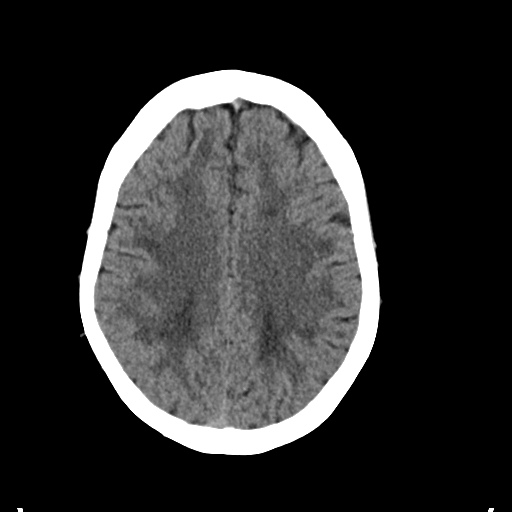
[im 20/32  bone]
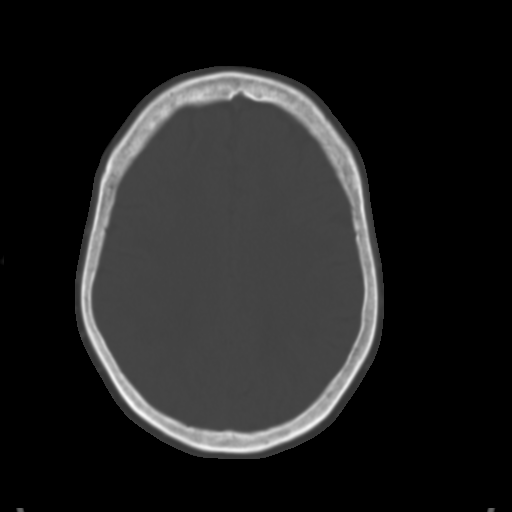
[im 24/32  brain]
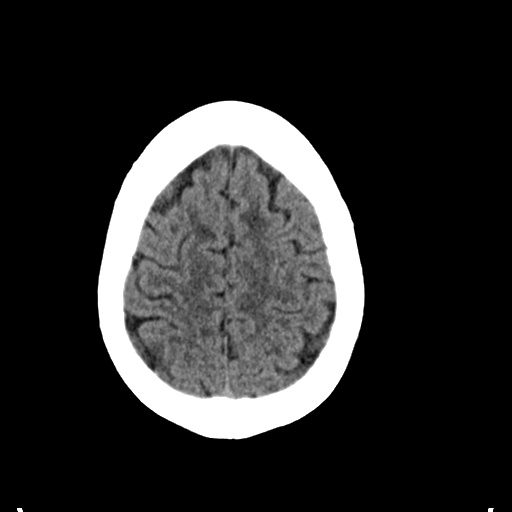
[im 28/32  brain]
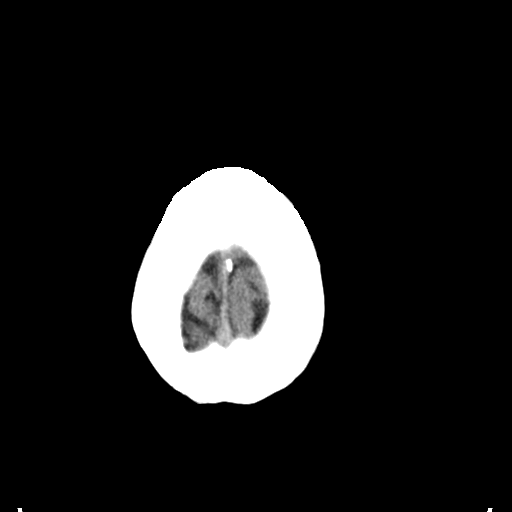

[Series 3: head bone · axial · 0.47mm/px · z∈[+1367,+1399]mm · 3 of 78 slices shown]
[im 8/78  bone]
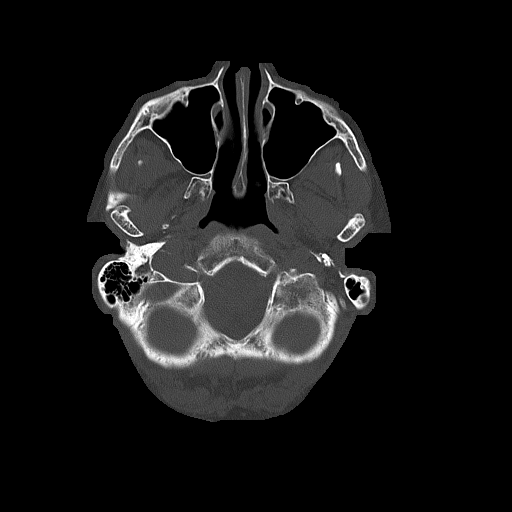
[im 16/78  bone]
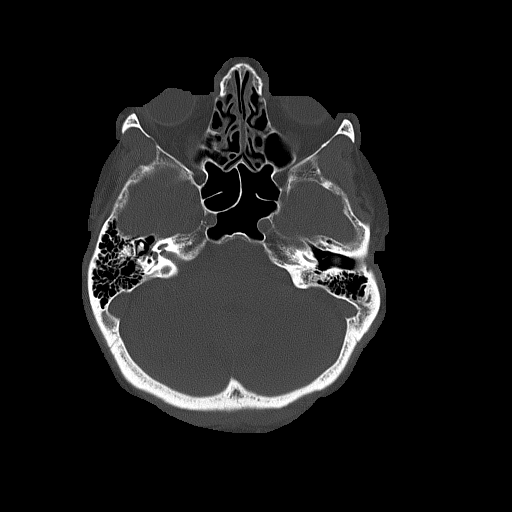
[im 24/78  bone]
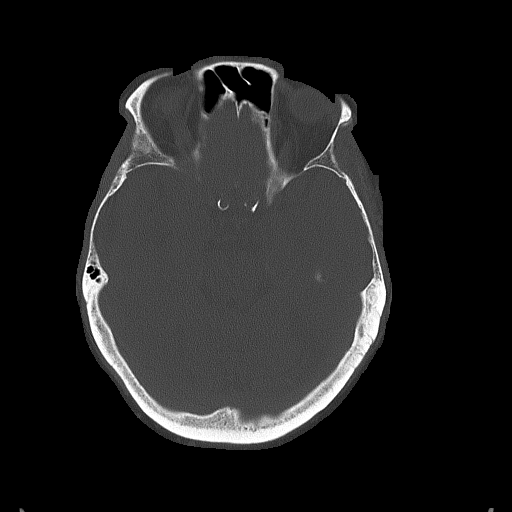

[Series 4: coronal soft tissue · coronal · 0.32mm/px · 3 of 72 slices shown]
[im 24/72  brain]
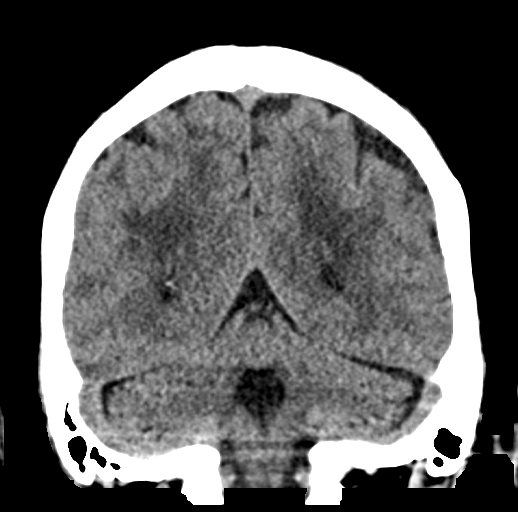
[im 32/72  brain]
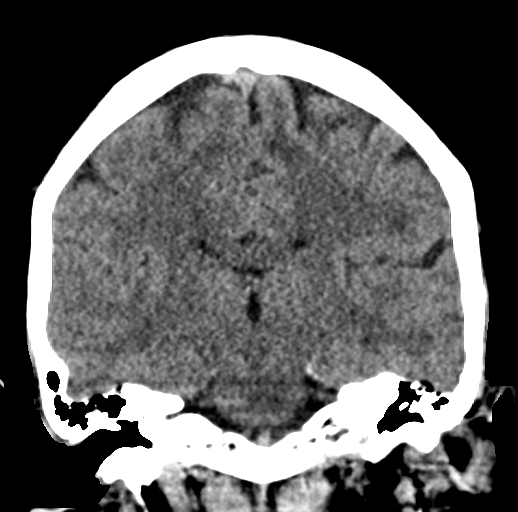
[im 40/72  brain]
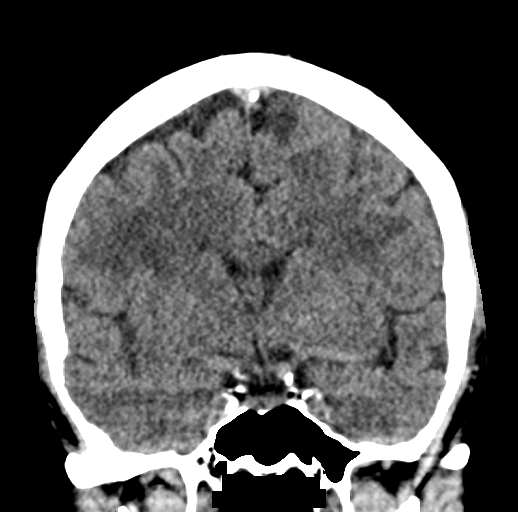

[Series 5: sagittal soft tissue · sagittal · 0.32mm/px · 3 of 55 slices shown]
[im 19/55  brain]
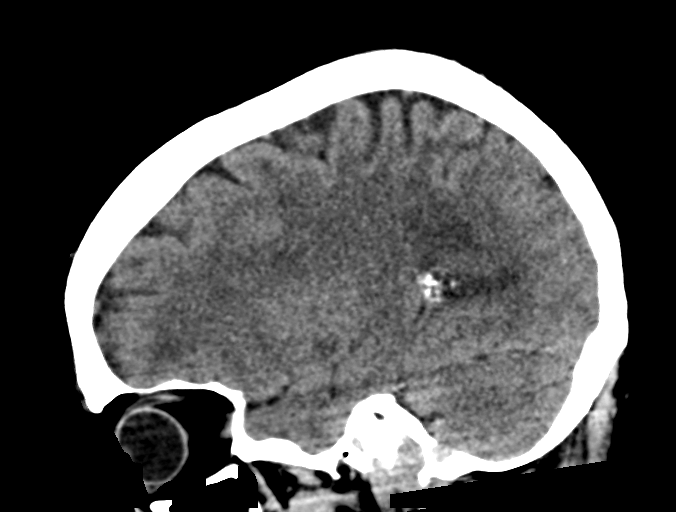
[im 28/55  brain]
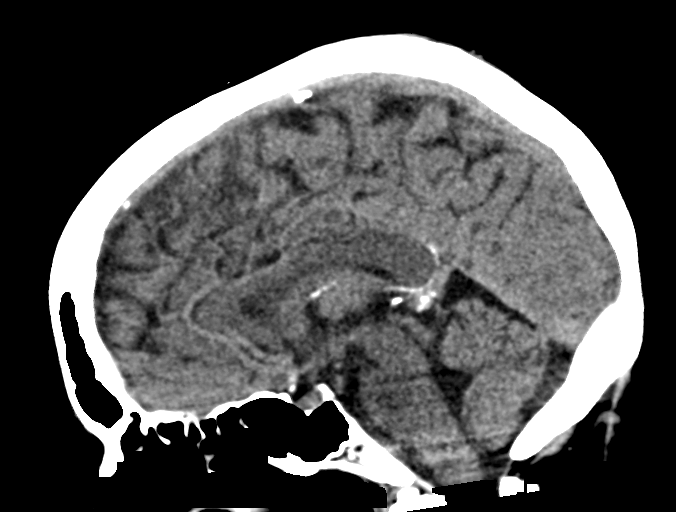
[im 37/55  brain]
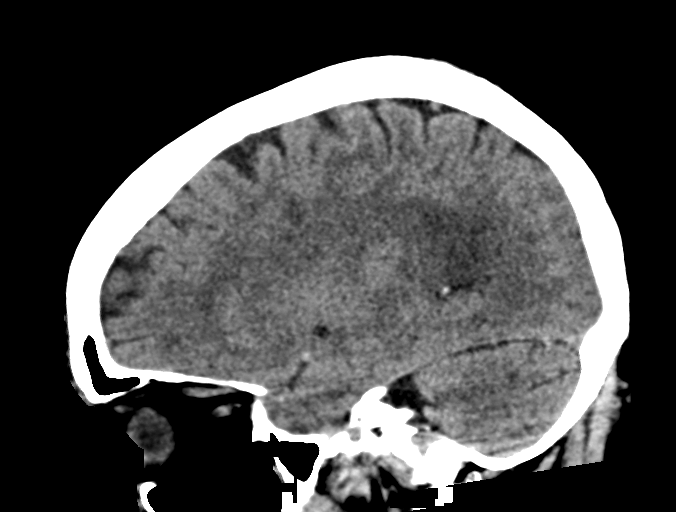

[16 of 47 positions shown; findings below may reference images not displayed]

FINDINGS: Brain: No evidence of acute large vascular territory infarction,
hemorrhage, hydrocephalus, extra-axial collection or mass
lesion/mass effect. Extensive patchy white matter hypodensities.

Vascular: No hyperdense vessel identified. Calcific intracranial
sclerosis.

Skull: No evidence of acute fracture.

Sinuses/Orbits: Mild paranasal sinus mucosal thickening.
Unremarkable orbits.

Other: No mastoid effusions.
IMPRESSION: 1. No evidence of acute traumatic abnormality.
2. Extensive patchy white matter hypodensities, which are
nonspecific but compatible with chronic microvascular ischemic
disease. Underlying acute white matter infarct is not excluded in
the absence of priors and if there is concern for acute infarct,
recommend MRI.

## 2022-08-06 ENCOUNTER — Telehealth: Payer: Self-pay | Admitting: Cardiology

## 2022-08-06 NOTE — Telephone Encounter (Signed)
Pt reports that her BP was 150/82.   Pt reports no s/s at all.   Instructed pt to keep a log of BP and hr for a week and send it in to [place] via MyChart. Told to check it 2 hours after taking BP meds- after sitting down for about 10 minutes.   Instructed her to be mindful of salt intake. Gave ER precautions. Told her that I would send this information to her provider as well.

## 2022-08-06 NOTE — Telephone Encounter (Signed)
Pt c/o BP issue: STAT if pt c/o blurred vision, one-sided weakness or slurred speech  1. What are your last 5 BP readings?  150/80 - today    2. Are you having any other symptoms (ex. Dizziness, headache, blurred vision, passed out)? No   3. What is your BP issue? Pt states her Ashley Morse took her bp was high. She has not recorded any other bp readings. She asked if she needs a meds adjustment or needs to be seen sooner. Please advise.

## 2022-08-06 NOTE — Telephone Encounter (Signed)
She should be due for follow-up soon.  We can reassess pressures but if she has occasional blood pressures in the 150 range, that is okay as long as it does not go up in the 170-180 range.  In that case I would want to see her sooner in order to potentially start a new medicine.  Glenetta Hew, MD

## 2022-09-20 ENCOUNTER — Encounter: Payer: Self-pay | Admitting: Cardiology

## 2022-09-21 NOTE — Telephone Encounter (Signed)
I do not think the metoprolol securely make that much of a difference for blood pressure.  If he truly is having average blood pressures in the 140/86 range, then probably needs to go back onto something like losartan or valsartan-she had previously been on something like that in the past and I am not sure why it came off.  Perhaps she could be seen either by APP here or one of our clinical pharmacist to help titrate medications.  Probably much easier than getting into see me.  Bryan Lemma, MD

## 2022-09-24 ENCOUNTER — Encounter: Payer: Self-pay | Admitting: Student

## 2022-09-24 ENCOUNTER — Other Ambulatory Visit: Payer: Self-pay | Admitting: Student

## 2022-09-24 ENCOUNTER — Ambulatory Visit: Payer: Medicare Other | Attending: Student | Admitting: Student

## 2022-09-24 VITALS — BP 148/90 | HR 67 | Ht 64.0 in | Wt 167.0 lb

## 2022-09-24 DIAGNOSIS — I2510 Atherosclerotic heart disease of native coronary artery without angina pectoris: Secondary | ICD-10-CM

## 2022-09-24 DIAGNOSIS — I8393 Asymptomatic varicose veins of bilateral lower extremities: Secondary | ICD-10-CM

## 2022-09-24 DIAGNOSIS — R600 Localized edema: Secondary | ICD-10-CM | POA: Diagnosis not present

## 2022-09-24 DIAGNOSIS — I10 Essential (primary) hypertension: Secondary | ICD-10-CM | POA: Diagnosis not present

## 2022-09-24 DIAGNOSIS — Z79899 Other long term (current) drug therapy: Secondary | ICD-10-CM

## 2022-09-24 DIAGNOSIS — I471 Supraventricular tachycardia, unspecified: Secondary | ICD-10-CM

## 2022-09-24 DIAGNOSIS — E785 Hyperlipidemia, unspecified: Secondary | ICD-10-CM

## 2022-09-24 MED ORDER — LOSARTAN POTASSIUM 25 MG PO TABS: 25.0000 mg | ORAL_TABLET | Freq: Every day | ORAL | 1 refills | Status: DC

## 2022-09-24 NOTE — Patient Instructions (Signed)
Medication Instructions:   START Losartan (Cozaar) 25 mg daily. Take 1 tablet by mouth daily  *If you need a refill on your cardiac medications before your next appointment, please call your pharmacy*  Lab Work: Marjie Skiff, PA-C recommends that you return for lab work in 1 week:  BMP  If you have labs (blood work) drawn today and your tests are completely normal, you will receive your results only by: MyChart Message (if you have MyChart) OR A paper copy in the mail If you have any lab test that is abnormal or we need to change your treatment, we will call you to review the results.  Testing/Procedures: NONE ordered at this time of appointment   Follow-Up: At [facility], you and your health needs are our priority.  As part of our continuing mission to provide you with exceptional heart care, we have created designated Provider Care Teams.  These Care Teams include your primary Cardiologist (physician) and Advanced Practice Providers (APPs -  Physician Assistants and Nurse Practitioners) who all work together to provide you with the care you need, when you need it.  Your next appointment:   As previously scheduled    Provider:   Bryan Lemma, MD     Other Instructions

## 2022-09-24 NOTE — Progress Notes (Signed)
Cardiology Office Note:    Date:  09/24/2022   ID:  Ashley Morse, DOB 31-Jan-1951, MRN [id number]  PCP:  Richmond Campbell., PA-C  Cardiologist:  Bryan Lemma, MD  Electrophysiologist:  None   Referring MD: Richmond Campbell., PA-C   Chief Complaint: hypertension  History of Present Illness:    Ashley Morse is a 72 y.o. female with a history of CAD with STEMI in 2010 s/p overlapping DES x2 to RCA, paroxysmal SVT, chronic bilateral lower extremity edema secondary to significant varicose veins and venous reflux s/p multiple ablations, hypertension, hyperlipidemia, hypothyroidism, and Hepatitis C who is followed by Dr. Herbie Baltimore and presents today for follow-up of hypertension.   Patient has a history of inferior STEMI in 09/2008 and underwent successful PCI with overlapping DES x2 to RCA at that time. It does not look like she has had an repeat ischemic evaluation since that time. She also has known chronic bilateral lower extremity edema secondary to significant varicose veins and venous reflux. She was previously followed by Dr. Arbie Cookey (Vascular Surgery) for this and has undergone multiple ablations in the  past (most recently in 2017). Monitor in 04/2017 for further evaluation of palpitations showed rare PVCs but no significant arrhythmias. Carotid dopplers in 04/2017 showed only 1-39% stenosis of the right ICA and no evidence of any stenosis on the left.   Patient was last seen by Dr. Herbie Baltimore in 09/2021 at which time she rare episodes of a rapid heart rates usually at rest occurring once or twice every 3-4 months but otherwise had no cardiac complaints. Her BP was well controlled at home but mildly elevated in the office. No medication changes were made.   Patient presents today to discuss her BP which has been elevated at home. Reviewed home BP log. BP has ranged  128-162/78-97 at home with BP usually above goal of <130/80 no significant difference after Toprol-XL was increased from 25 to  50mg  last month. Patient states she has otherwise been doing well. She states she has "aches and pains like most people my age" and has problems with arthritis but is doing well from a cardiac standpoint. No chest pain or shortness of breath. She sometimes has to sleep on a slight incline in order to breathe easier but she attributes this to her sinuses. This is not new and is stable. It does not sound like true orthopnea. No PND and edema. She still has occasional palpitations but this seems to have improved. She has noticed that palpitations usually are related to stress. No lightheadedness/ dizziness or syncope.   Past Medical History:  Diagnosis Date   Bilateral venous insufficiency    Bilateral lower extremity edema,significant varicose vein; s/p vein stripping and venous abaltionof greater saph. vein  --> proved to be unsuccessful 2/2 vessel size.; Occasionally wears compression stockings during the wintertime.  cannot tolerate during the summertime;    CAD S/P percutaneous coronary angioplasty 09/15/2008   Distal RPDA: 2 overlapping Promus and DES 2.5 mm x 15 mm, 2.5 mm 23 mm distal to mid RCA. EF 50%. ([place], [place])    Complication of anesthesia    Pt states that many yrs ago after having a bunionectomy she experienced severe N & V. She is uncertain if the N & V was from the anesthesia or the antibiotic that she was given. She has not had any problems since with anesthesia. per 05/21/21 phone call w/patient   Complication of anesthesia    pt states she had  some bleeding after partial hysterectomy 1975, no blood transfusion requiredper pt on phone call 05-22-2021   COVID 02/2021   mild symptoms - no treatment   Family history of adverse reaction to anesthesia    pt denied a family hx of anesthesia complications per 05/21/21 phone call   Heart murmur    mild per pt   Hemorrhoids    Hepatitis    Pt states that the blood bank told her that she could no longer give blood due to Hep C. Pt  had a liver bx >10 years ago. She states that she was never clear on whether she has Hep C. She has not taken any treatment as of 05/21/21.   Hepatitis C    History of blood transfusion    1971   History of: ST elevation myocardial infarction (STEMI) of inferior wall, subsequent episode of care 09/15/2008   R- PDA occlusion --> PCI with DES   Hyperlipidemia    on simvastatin stopped due to increase liver enzyme rechecked back to normal   Hypertension    Hypothyroidism    On levothyroxine   Osteoarthritis    Presence of drug coated stent in right coronary artery 09/15/2008   See above; no longer on DAPT.   Varicose veins     Past Surgical History:  Procedure Laterality Date   ABDOMINAL HYSTERECTOMY     around 2004   bilateral bunion surgery     2014 or 2015 both feet   BLADDER SUSPENSION N/A 05/27/2021   Procedure: TRANSVAGINAL TAPE (TVT) PROCEDURE;  Surgeon: [name], MD;  Location: [facility];  Service: Gynecology;  Laterality: N/A;   BUNIONECTOMY     CARDIAC CATHETERIZATION  10/23/2008   in [place]- for inferior STEMI, occluded RPDA   COLONOSCOPY  04/15/2015   Dr. Lupe Carney   CORONARY ANGIOPLASTY WITH STENT PLACEMENT  10/23/2008   occulsion of RPDA  -- 2 Promus DES stent overlapping ;2.5 x 15 and 2.5 x 23 mm stents from distal RCA to PDA, ef50%   CYSTOCELE REPAIR     around 2004   CYSTOSCOPY N/A 05/27/2021   Procedure: CYSTOSCOPY;  Surgeon: [name], MD;  Location: [facility];  Service: Gynecology;  Laterality: N/A;   ENDOVENOUS ABLATION SAPHENOUS VEIN W/ LASER Left 08/01/2015   endovenous laser ablation left greater saphenous vein by Gretta Began MD   ENDOVENOUS ABLATION SAPHENOUS VEIN W/ LASER Right 08/15/2015   endovenous laser ablation right greater saphenous vein by Gretta Began MD   left venous  duplex Left 08/31/2011   Negative for DVT   LIVER BIOPSY     around 2000, pt unsure of date x 2   Radiofrequency Ablation  - Left Greater Saphenous Vein: Unsuccessful Left 08/31/2011   ROBOTIC ASSISTED LAPAROSCOPIC SACROCOLPOPEXY N/A 05/27/2021   Procedure: XI ROBOTIC ASSISTED LAPAROSCOPIC SACROCOLPOPEXY;  Surgeon: [name], MD;  Location[facility];  Service: Gynecology;  Laterality: N/A;   TRANSTHORACIC ECHOCARDIOGRAM  10/2008    Echo, May 2010 - following PCI for inferior STEMI. No wall motion abnormalities, EF of 60%   VARICOSE VEIN SURGERY Right    WISDOM TOOTH EXTRACTION      Current Medications: Current Meds  Medication Sig   ascorbic acid (VITAMIN C) 100 MG tablet    aspirin EC 81 MG tablet Take 81 mg by mouth daily.   b complex vitamins tablet Take 1 tablet by mouth daily.   cholecalciferol (VITAMIN D) 25  MCG (1000 UT) tablet Take 1 tablet by mouth daily.    estradiol (ESTRACE) 0.1 MG/GM vaginal cream    Evolocumab (REPATHA SURECLICK) 140 MG/ML SOAJ INJECT 140 MG UNDER THE SKIN EVERY 14 DAYS   latanoprost (XALATAN) 0.005 % ophthalmic solution 1 drop at bedtime.   levothyroxine (SYNTHROID, LEVOTHROID) 88 MCG tablet Take 88 mcg by mouth daily before breakfast.   losartan (COZAAR) 25 MG tablet Take 1 tablet (25 mg total) by mouth daily.   Magnesium 400 MG CAPS Take by mouth daily.   metoprolol succinate (TOPROL-XL) 50 MG 24 hr tablet Take 50 mg by mouth daily.   naproxen sodium (ALEVE) 220 MG tablet Take 220 mg by mouth daily as needed. PRN   polyethylene glycol powder (GLYCOLAX/MIRALAX) 17 GM/SCOOP powder Take 17 g by mouth daily. Drink 17g (1 scoop) dissolved in water per day.   TURMERIC PO Take 800 mg by mouth daily.   [DISCONTINUED] metoprolol tartrate (LOPRESSOR) 50 MG tablet Take 50 mg by mouth daily.     Allergies:   Benzalkonium chloride, Lactose, Lactose intolerance (gi), Lisinopril, Pravastatin, Statins, Sulfa antibiotics, Sulfamethoxazole-trimethoprim, and Prednisone   Social History   Socioeconomic History   Marital status: Married    Spouse name: Not on  file   Number of children: Not on file   Years of education: Not on file   Highest education level: Not on file  Occupational History   Not on file  Tobacco Use   Smoking status: Never   Smokeless tobacco: Never  Vaping Use   Vaping Use: Never used  Substance and Sexual Activity   Alcohol use: Yes    Alcohol/week: 1.0 standard drink of alcohol    Types: 1 Glasses of wine per week    Comment: Occasionally   Drug use: Never   Sexual activity: Not Currently  Other Topics Concern   Not on file  Social History Narrative   She is a married, mother of one living offspring with one child who died at young age.    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    She is starting to "embrace retirement" & i8 enjoying her free time.      She starting beekeeping and other activities.       She still trying to exercise the gym 2-3 times a week but now is up to 3-4 times a week in addition to doing walking.       She drinks occasional glass of wine, but if she drinks more not bothered her hemorrhoids.   She does not smoke.   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   Social Determinants of Health   Financial Resource Strain: Not on file  Food Insecurity: Not on file  Transportation Needs: Not on file  Physical Activity: Not on file  Stress: Not on file  Social Connections: Not on file     Family History: The patient's family history includes Diabetes in her sister; Heart disease in her mother; Lymphoma in her daughter; Stroke in her mother; Varicose Veins in her mother and paternal grandfather.  ROS:   Please see the history of present illness.      EKGs/Labs/Other Studies Reviewed:    The following studies were reviewed:  Monitor 04/28/2017 to 05/22/2017: Normal 30-day event monitor: Mostly sinus rhythm with rare sinus tachycardia. Average heart rate 79 bpm. Rare PVCs No PACs No arrhythmias -i.e.: No  A. fib, A  flutter, PAT, SVT, VT   Essentially normal Monitor - No arrhythmias or even significant ectopy. _______________  Carotid Dopplers 04/29/2017: Final Interpretation: - Right Carotid: There is evidence in the right ICA of a 1-39% stenosis.  - Left Carotid: There is no evidence of stenosis in the left ICA.  - Vertebrals:  Both vertebral arteries were patent with antegrade flow.  - Subclavians: Normal flow hemodynamics were seen in bilateral subclavian arteries.   EKG:  EKG ordered today. EKG personally reviewed and demonstrates normal sinus rhythm, rate 67 bpm, with 1st degree AV block and no acute ST/T changes. Normal axis. QTc 450 ms.  Recent Labs: No results found for requested labs within last 365 days.  Recent Lipid Panel    Component Value Date/Time   CHOL 105 04/02/2021 1442   TRIG 56 04/02/2021 1442   HDL 64 04/02/2021 1442   CHOLHDL 1.6 04/02/2021 1442   LDLCALC 28 04/02/2021 1442    Physical Exam:    Vital Signs: BP (!) 148/90   Pulse 67   Ht 5\' 4"  (1.626 m)   Wt 167 lb (75.8 kg)   SpO2 97%   BMI 28.67 kg/m     Wt Readings from Last 3 Encounters:  09/24/22 167 lb (75.8 kg)  10/07/21 162 lb 3.2 oz (73.6 kg)  05/27/21 158 lb 3.2 oz (71.8 kg)     General: 72 y.o. Caucasian female in no acute distress. HEENT: Normocephalic and atraumatic. Sclera clear.  Neck: Supple. No carotid bruits. No JVD. Heart: RRR. Distinct S1 and S2. No murmurs, gallops, or rubs. Radial pulses 2+ and equal bilaterally. Lungs: No increased work of breathing. Clear to ausculation bilaterally. No wheezes, rhonchi, or rales.  Abdomen: Soft, non-distended, and non-tender to palpation.  Extremities: Trace lower extremity edema bilaterally. Varicose veins bilaterally.     Skin: Warm and dry. Neuro: Alert and oriented x3. No focal deficits. Psych: Normal affect. Responds appropriately.   Assessment:    1. Primary hypertension   2. Coronary artery disease involving native coronary artery of  native heart without angina pectoris   3. Paroxysmal SVT (supraventricular tachycardia)   4. Lower extremity edema   5. Varicose veins of both lower extremities, unspecified whether complicated   6. Hyperlipidemia, unspecified hyperlipidemia type   7. Medication management     Plan:    Hypertension BP elevated. Initially 154/88 and then 148/90 on my personal recheck at the end of visit. BP usually above goal at home.  - Currently only on Toprol-XL 50mg  daily. Continue this and re-start Losartan 25mg  daily. She was previously on Losartan. She does not remember why it was stopped but thinks she tolerated it well.  - Will repeat BMET in 1 week.  - Advised patient to continue to monitor BP at home and let us know if consistently >130/80.   CAD History of STEMI in 2010 s/p overlapping DES x2 to RCA. - No angina. - Continue Aspirin 81mg  daily. - Intolerant to statins. On Repatha.   Paroxysmal SVT Per review of prior cardiac notes, she has a history of paroxysmal SVT. Monitor in 04/2017 showed rare PVC but no significant arrhythmias.  - Palpitations stable.  - Continue Toprol-XL 50mg  daily.  Chronic Lower Extremity Edema Varicose Veins Patient has chronic bilateral loewr extremity edema secondary to significant varicose veins and venous reflux. She was previously followed by Dr. Arbie Cookey (Vascular Surgery) for this and has undergone multiple ablations in the  past (most recently in 2017). -  Stable. On trace edema on exam.  - Continue wearing compressions stocking and elevating legs as much as possible.   Hyperlipidemia Lipid panel in 08/2022: Total Cholesterol 129, Triglycerides 70, HDL 66, LDL 48. LDL goal <70 given CAD. - Intolerant to statins. On Repatha.   Disposition: Patient already has a follow-up visit with Dr. Herbie Baltimore scheduled for next month. Will keep this.    Medication Adjustments/Labs and Tests Ordered: Current medicines are reviewed at length with the patient today.   Concerns regarding medicines are outlined above.  Orders Placed This Encounter  Procedures   Basic metabolic panel   EKG 12-Lead   Meds ordered this encounter  Medications   losartan (COZAAR) 25 MG tablet    Sig: Take 1 tablet (25 mg total) by mouth daily.    Dispense:  30 tablet    Refill:  1    Patient Instructions  Medication Instructions:   START Losartan (Cozaar) 25 mg daily. Take 1 tablet by mouth daily  *If you need a refill on your cardiac medications before your next appointment, please call your pharmacy*  Lab Work: Marjie Skiff, PA-C recommends that you return for lab work in 1 week:  BMP  If you have labs (blood work) drawn today and your tests are completely normal, you will receive your results only by: MyChart Message (if you have MyChart) OR A paper copy in the mail If you have any lab test that is abnormal or we need to change your treatment, we will call you to review the results.  Testing/Procedures: NONE ordered at this time of appointment   Follow-Up: At [facility], you and your health needs are our priority.  As part of our continuing mission to provide you with exceptional heart care, we have created designated Provider Care Teams.  These Care Teams include your primary Cardiologist (physician) and Advanced Practice Providers (APPs -  Physician Assistants and Nurse Practitioners) who all work together to provide you with the care you need, when you need it.  Your next appointment:   As previously scheduled    Provider:   Bryan Lemma, MD     Other Instructions     Signed, Corrin Parker, PA-C  09/24/2022 11:12 PM    [facility]

## 2022-10-06 LAB — BASIC METABOLIC PANEL
BUN/Creatinine Ratio: 14 (ref 12–28)
BUN: 11 mg/dL (ref 8–27)
CO2: 25 mmol/L (ref 20–29)
Calcium: 10 mg/dL (ref 8.7–10.3)
Chloride: 103 mmol/L (ref 96–106)
Creatinine, Ser: 0.77 mg/dL (ref 0.57–1.00)
Glucose: 119 mg/dL — ABNORMAL HIGH (ref 70–99)
Potassium: 4.3 mmol/L (ref 3.5–5.2)
Sodium: 139 mmol/L (ref 134–144)
eGFR: 81 mL/min/{1.73_m2} (ref >59)

## 2022-10-19 ENCOUNTER — Encounter: Payer: Self-pay | Admitting: Cardiology

## 2022-10-19 ENCOUNTER — Ambulatory Visit: Payer: Medicare Other | Attending: Cardiology | Admitting: Cardiology

## 2022-10-19 VITALS — BP 130/80 | HR 67 | Ht 64.0 in | Wt 167.4 lb

## 2022-10-19 DIAGNOSIS — I2510 Atherosclerotic heart disease of native coronary artery without angina pectoris: Secondary | ICD-10-CM | POA: Diagnosis not present

## 2022-10-19 DIAGNOSIS — Z955 Presence of coronary angioplasty implant and graft: Secondary | ICD-10-CM

## 2022-10-19 DIAGNOSIS — I252 Old myocardial infarction: Secondary | ICD-10-CM

## 2022-10-19 DIAGNOSIS — M25562 Pain in left knee: Secondary | ICD-10-CM

## 2022-10-19 DIAGNOSIS — I471 Supraventricular tachycardia, unspecified: Secondary | ICD-10-CM | POA: Diagnosis not present

## 2022-10-19 DIAGNOSIS — M25561 Pain in right knee: Secondary | ICD-10-CM

## 2022-10-19 DIAGNOSIS — I10 Essential (primary) hypertension: Secondary | ICD-10-CM

## 2022-10-19 DIAGNOSIS — E785 Hyperlipidemia, unspecified: Secondary | ICD-10-CM | POA: Diagnosis not present

## 2022-10-19 DIAGNOSIS — G8929 Other chronic pain: Secondary | ICD-10-CM

## 2022-10-19 DIAGNOSIS — I83893 Varicose veins of bilateral lower extremities with other complications: Secondary | ICD-10-CM

## 2022-10-19 DIAGNOSIS — I872 Venous insufficiency (chronic) (peripheral): Secondary | ICD-10-CM

## 2022-10-19 MED ORDER — LOSARTAN POTASSIUM 25 MG PO TABS: 25.0000 mg | ORAL_TABLET | Freq: Every day | ORAL | 3 refills | Status: DC

## 2022-10-19 NOTE — Patient Instructions (Signed)
Medication Instructions:  No changes   May use Tylenol as need for knee pain  *If you need a refill on your cardiac medications before your next appointment, please call your pharmacy*   Lab Work: Not needed .   Testing/Procedures:  Not needed  Follow-Up: At [facility], you and your health needs are our priority.  As part of our continuing mission to provide you with exceptional heart care, we have created designated Provider Care Teams.  These Care Teams include your primary Cardiologist (physician) and Advanced Practice Providers (APPs -  Physician Assistants and Nurse Practitioners) who all work together to provide you with the care you need, when you need it.     Your next appointment:   12 month(s)  The format for your next appointment:   In Person  Provider:   Bryan Lemma, MD    Other Instructions   You have been referred to [facility]-- left knee

## 2022-10-19 NOTE — Progress Notes (Signed)
Primary Care Provider: [facility] Cardiologist: Bryan Lemma, MD Electrophysiologist: None  Clinic Note: Chief Complaint  Patient presents with   Follow-up    BP check   Coronary Artery Disease    No angina    ===================================  ASSESSMENT/PLAN   Problem List Items Addressed This Visit       Cardiology Problems   Venous insufficiency of both lower extremities (Chronic)    Stable mild edema.  Varicose veins and spider veins noted.  Continue recommend support stockings over diuretic. Afterload reduction with ARB Foot elevation and support stockings      Relevant Medications   losartan (COZAAR) 25 MG tablet   PSVT (paroxysmal supraventricular tachycardia) (Chronic)    Actively, no breakthrough spells.  Doing better on higher dose of Toprol 50 mg daily.  Discussed vagal maneuvers and avoid triggers      Relevant Medications   losartan (COZAAR) 25 MG tablet   Hyperlipidemia with target LDL less than 70; intolerant of statin in the past due to elevated LFTs (Chronic)    Goal LDL less than 70 and closer to 50.  Well-controlled on most recent labs.  Doing well on Repatha-tolerating well.  Labs were just checked      Relevant Medications   losartan (COZAAR) 25 MG tablet   Essential hypertension (Chronic)    Blood pressure at home seems to be much better than last recording showed.  The losartan has been making a difference.  Not unreasonable in the setting of CAD some diastolic dysfunction to have afterload reduction.  Plan: Continue losartan 25 mg daily-have increased to 90-day supply.  Continue Toprol 50 mg daily      Relevant Medications   losartan (COZAAR) 25 MG tablet   Coronary artery disease involving native coronary artery of native heart without angina pectoris - Primary (Chronic)    Doing well with no active angina or heart failure symptoms.  History of inferior STEMI in 2010 with overlapping stents in  the PDA. Remains asymptomatic with no active cardiac symptoms of chest pain or dyspnea with rest or exertion.  Mostly just a little deconditioning.  Plan: On low-dose aspirin. Now on both losartan 25 mg and Toprol 50 mg with well-controlled BP and no angina. Lipids much better controlled on Repatha.      Relevant Medications   losartan (COZAAR) 25 MG tablet     Other   Presence of drug-eluting stent in right coronary artery (Chronic)    Remains on aspirin for prior PCI.  Not on an additional antiplatelet agent.   Okay to hold aspirin for vascular or high risk procedures or surgeries-45 days out.      Knee pain, right (Chronic)    Currently now has right knee over left knee pain.  Is pending evaluation by pain evaluation.  I have given her the name of [place] [facility] for referral.      History of acute inferior wall MI (Chronic)    14 years out from inferior STEMI with PDA occlusion and 2 overlapping stents.  Preserved EF on echo.  May be some diastolic dysfunction but no true active heart failure.  No angina or actually heart failure.      Other Visit Diagnoses     Left knee pain, unspecified chronicity       Relevant Orders   Ambulatory referral to Orthopedics      ===================================  HPI:    Ashley Morse is a 72 y.o.  female with a PMH below who presents today for 1 month follow-up after being seen by PA.  Here for BP check.. Referring Provider  Richmond Campbell., PA-C.  Pertinent PMH CAD-PCI  Inf STEMI - -rPDA  (2010) Chronic Bilateral Lower Edema (related to significant varicose veins and venous reflux-status post multiple BLE  ablations)  H/p PSVT HTN /-> somewhat labile HLD -> statin intolerant (statin related myalgias); on Repatha.  I last saw Ashley Morse a year ago in May 2023.  She was to have a few rare episodes of heart racing.  But both twice every couple months.  No other cardiac complaints.  BP controlled at  home but better high in the office. She was seen by Margaretmary Dys, RPH in [facility] clinic for Repatha.  She was just seen seen on Sep 24, 2022 by Marjie Skiff, PA noting that her blood pressures at home and somewhat elevated ranging from 128-62/78-97.  Usually above the goal of 130/80.  Toprol dose been increased to 50 mg and did not notice any difference.  She had mild aches and pains but no major issues.  No cardiac symptoms of chest pain or dyspnea.  Sometimes sleeps on an incline because of sinus issues to help her breathing and drainage.  No PND or orthopnea to go with her edema.  Occasional palpitations but overall stable.  Usually they are related to stress. Restarted losartan 25 mg daily => BMP checked with plan for close follow-up.  Recent Hospitalizations: None  Reviewed  CV studies:    The following studies were reviewed today: (if available, images/films reviewed: From Epic Chart or Care Everywhere) None:  Interval History:   Ashley Morse returns here today for close follow-up to discuss results of her blood pressure.  She has been paying attention to her fluid intake and salt loading.  She says at home her blood pressures and pretty well-controlled with her recording showing a low of 113/77 high of 143/83 5.  Most of them are in the 110s to 120s range.  It seems that the lower losartan has been doing a pretty good job.  She is little bit worried that her BP has been elevated lately because of her knees hurting her.  She has been limited walking because of left knee pain and is fearful of what she can take because she is scared of taking NSAIDs and Tylenol.  Other than the swelling with the knee, her lower extremity swelling is actually stable.  No PND or orthopnea.  No resting or exertional chest pain or pressure.  No resting or exertional dyspnea.   Her heart rate episodes have notably improved since her beta-blocker dose was increased to 50 mg.  No lightheadedness dizziness  unless she stands up too quickly.  No syncope or near syncope.  No TIA or emesis tegaserod no claudication.  REVIEWED OF SYSTEMS   Review of Systems  Constitutional:  Negative for malaise/fatigue (Not as energetic as he used to be.) and weight loss.  HENT:  Negative for congestion.   Respiratory:  Negative for cough and shortness of breath.   Gastrointestinal:  Negative for blood in stool and melena.  Genitourinary:  Negative for hematuria.  Musculoskeletal:  Positive for joint pain (Mostly left knee pain => lots of pain walking.).  Neurological:  Negative for dizziness.  Psychiatric/Behavioral: Negative.      I have reviewed and (if needed) personally updated the patient's problem list, medications, allergies, past medical and surgical history, social and  family history.   PAST MEDICAL HISTORY   Past Medical History:  Diagnosis Date   Bilateral venous insufficiency    Bilateral lower extremity edema,significant varicose vein; s/p vein stripping and venous abaltionof greater saph. vein  --> proved to be unsuccessful 2/2 vessel size.; Occasionally wears compression stockings during the wintertime.  cannot tolerate during the summertime;    CAD S/P percutaneous coronary angioplasty 09/15/2008   Distal RPDA: 2 overlapping Promus and DES 2.5 mm x 15 mm, 2.5 mm 23 mm distal to mid RCA. EF 50%. ([place], [place])    Complication of anesthesia    Pt states that many yrs ago after having a bunionectomy she experienced severe N & V. She is uncertain if the N & V was from the anesthesia or the antibiotic that she was given. She has not had any problems since with anesthesia. per 05/21/21 phone call w/patient   COVID 02/2021   mild symptoms - no treatment   Family history of adverse reaction to anesthesia    pt denied a family hx of anesthesia complications per 05/21/21 phone call   Hemorrhoids    Hepatitis C    Pt states that the blood bank told her that she could no longer give blood due  to Hep C. Pt had a liver bx >10 years ago. She states that she was never clear on whether she has Hep C. She has not taken any treatment as of 05/21/21.   History of blood transfusion    1971   History of: ST elevation myocardial infarction (STEMI) of inferior wall, subsequent episode of care 09/15/2008   R- PDA occlusion --> PCI with DES   Hyperlipidemia    on simvastatin stopped due to increase liver enzyme rechecked back to normal   Hypertension    Hypothyroidism    On levothyroxine   Osteoarthritis    Presence of drug coated stent in right coronary artery 09/15/2008   See above; no longer on DAPT.   Varicose veins     PAST SURGICAL HISTORY - reviewed   :Pertinent CV Procedures  Procedure Laterality Date   CARDIAC CATHETERIZATION  10/23/2008   in [place]- for inferior STEMI, occluded RPDA   CORONARY ANGIOPLASTY WITH STENT PLACEMENT  10/23/2008   occulsion of RPDA  -- 2 Promus DES stent overlapping ;2.5 x 15 and 2.5 x 23 mm stents from distal RCA to PDA, ef50%   ENDOVENOUS ABLATION SAPHENOUS VEIN W/ LASER Left 08/01/2015   endovenous laser ablation left greater saphenous vein by Gretta Began MD   ENDOVENOUS ABLATION SAPHENOUS VEIN W/ LASER Right 08/15/2015   endovenous laser ablation right greater saphenous vein by Gretta Began MD   left venous  duplex Left 08/31/2011   Negative for DVT   LIVER BIOPSY     around 2000, pt unsure of date x 2   Radiofrequency Ablation - Left Greater Saphenous Vein: Unsuccessful Left 08/31/2011   TRANSTHORACIC ECHOCARDIOGRAM  10/2008    Echo, May 2010 - following PCI for inferior STEMI. No wall motion abnormalities, EF of 60%   VARICOSE VEIN SURGERY Right   Monitor in 04/2017 for further evaluation of palpitations showed rare PVCs but no significant arrhythmias.  Carotid dopplers in 04/2017 showed only 1-39% stenosis of the right ICA and no evidence of any stenosis on the left.   MEDICATIONS/ALLERGIES   Current Meds  Medication Sig   ascorbic  acid (VITAMIN C) 100 MG tablet    aspirin EC 81 MG tablet Take  81 mg by mouth daily.   b complex vitamins tablet Take 1 tablet by mouth daily.   cholecalciferol (VITAMIN D) 25 MCG (1000 UT) tablet Take 1 tablet by mouth daily.    estradiol (ESTRACE) 0.1 MG/GM vaginal cream    Evolocumab (REPATHA SURECLICK) 140 MG/ML SOAJ INJECT 140 MG UNDER THE SKIN EVERY 14 DAYS   latanoprost (XALATAN) 0.005 % ophthalmic solution 1 drop at bedtime.   levothyroxine (SYNTHROID, LEVOTHROID) 88 MCG tablet Take 88 mcg by mouth daily before breakfast.   Magnesium 400 MG CAPS Take by mouth daily.   metoprolol succinate (TOPROL-XL) 50 MG 24 hr tablet Take 50 mg by mouth daily.   naproxen sodium (ALEVE) 220 MG tablet Take 220 mg by mouth daily as needed. PRN   polyethylene glycol powder (GLYCOLAX/MIRALAX) 17 GM/SCOOP powder Take 17 g by mouth daily. Drink 17g (1 scoop) dissolved in water per day.   TURMERIC PO Take 800 mg by mouth daily.   []  losartan (COZAAR) 25 MG tablet Take 1 tablet (25 mg total) by mouth daily.    Allergies  Allergen Reactions   Benzalkonium Chloride Other (See Comments)    unknown   Lactose Other (See Comments)    unknown   Lactose Intolerance (Gi)    Lisinopril Cough    unknown   Pravastatin     Elevated LFTs   Statins     Atorvastatin,simvastain. Rosuvastatin    Sulfa Antibiotics Swelling    Skin discoloration   Sulfamethoxazole-Trimethoprim     Other reaction(s): Other (See Comments) unknown   Prednisone Rash    Rash/swelling    SOCIAL HISTORY/FAMILY HISTORY   Reviewed in Epic:  Pertinent findings: Non-smoker.  Maybe 1 glass of wine a week.   Social History   Social History Narrative   She is a married, mother of one living offspring with one child who died at young age.    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    She is starting to "embrace retirement" & i8 enjoying her free time.      She starting beekeeping and other  activities.       She still trying to exercise the gym 2-3 times a week but now is up to 3-4 times a week in addition to doing walking.       She drinks occasional glass of wine, but if she drinks more not bothered her hemorrhoids.   She does not smoke.   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    OBJCTIVE -PE, EKG, labs   Wt Readings from Last 3 Encounters:  10/19/22 167 lb 6.4 oz (75.9 kg)  09/24/22 167 lb (75.8 kg)  10/07/21 162 lb 3.2 oz (73.6 kg)    Physical Exam: BP 130/80 (BP Location: Right Arm, Patient Position: Sitting, Cuff Size: Normal)   Pulse 67   Ht 5\' 4"  (1.626 m)   Wt 167 lb 6.4 oz (75.9 kg)   SpO2 96%   BMI 28.73 kg/m  Physical Exam Vitals reviewed.  Constitutional:      General: She is not in acute distress.    Appearance: Normal appearance. She is normal weight. She is not ill-appearing or toxic-appearing.  HENT:     Head: Normocephalic and atraumatic.  Neck:     Vascular: No carotid bruit.  Cardiovascular:     Rate and Rhythm: Normal rate and regular rhythm.  Pulses: Normal pulses.     Heart sounds: Normal heart sounds.  Pulmonary:     Effort: Pulmonary effort is normal. No respiratory distress.     Breath sounds: Normal breath sounds. No wheezing, rhonchi or rales.  Musculoskeletal:        General: No swelling (Trivial actual swelling-1+ bilateral.).     Cervical back: Normal range of motion and neck supple.  Skin:    General: Skin is warm and dry.     Comments: Prominent swelling varicose veins and spider veins bilateral legs. Diffuse spider veins/small varicose veins in the ankles and feet on both legs.  Less prominent spider veins and more varicose veins in the lower legs to mid thighs.  Minimal true venous stasis change.   Neurological:     General: No focal deficit present.     Mental Status: She is alert and oriented to person, place, and time.  Psychiatric:        Mood and Affect: Mood normal.         Behavior: Behavior normal.        Thought Content: Thought content normal.        Judgment: Judgment normal.    Adult ECG Report Not checked  Recent Labs:  08/17/2022 TC 129, Tg 70, HDL 66, LDL 48; TSH 2.213 WBC 4.40 - 11.00 10*3/uL 4.80  RBC 4.10 - 5.10 10*6/uL 4.29  Hemoglobin 12.3 - 15.3 g/dL 16.1  Hematocrit 09.6 - 44.6 % 38.8  Platelet Count (PLT) 150 - 450 10*3/uL 203   Ref Range & Units   Sodium 136 - 145 mmol/L 139  Potassium 3.5 - 5.1 mmol/L 4.1  Comment: NO VISIBLE HEMOLYSIS  Chloride 98 - 107 mmol/L 105  CO2 21 - 31 mmol/L 27  Anion Gap 6 - 14 mmol/L 7  Glucose, Random 70 - 99 mg/dL 91  Blood Urea Nitrogen (BUN) 7 - 25 mg/dL 13  Creatinine 0.45 - 4.09 mg/dL 8.11  eGFR >91 YN/WGN/5.62Z3 90  Albumin 3.5 - 5.7 g/dL 4.1  Total Protein 6.4 - 8.9 g/dL 7.6  Bilirubin, Total 0.3 - 1.0 mg/dL 0.6  Alkaline Phosphatase (ALP) 34 - 104 U/L 65  Aspartate Aminotransferase (AST) 13 - 39 U/L 50 High   Alanine Aminotransferase (ALT) 7 - 52 U/L 41  Calcium 8.6 - 10.3 mg/dL 8.9   Lab Results  Component Value Date   CREATININE 0.77 10/05/2022   BUN 11 10/05/2022   NA 139 10/05/2022   K 4.3 10/05/2022   CL 103 10/05/2022   CO2 25 10/05/2022     No results found for: "HGBA1C" No results found for: "TSH"  ================================================== I spent a total of 27 minutes with the patient spent in direct patient consultation.  Additional time spent with chart review  / charting (studies, outside notes, etc): 16 min Total Time: 43 min  Current medicines are reviewed at length with the patient today.  (+/- concerns) none  Notice: This dictation was prepared with Dragon dictation along with smart phrase technology. Any transcriptional errors that result from this process are unintentional and may not be corrected upon review.  Studies Ordered:   Orders Placed This Encounter  Procedures   Ambulatory referral to Orthopedics   Meds ordered  this encounter  Medications   losartan (COZAAR) 25 MG tablet    Sig: Take 1 tablet (25 mg total) by mouth daily.    Dispense:  90 tablet    Refill:  3    Patient Instructions /  Medication Changes & Studies & Tests Ordered   Patient Instructions  Medication Instructions:  No changes   May use Tylenol as need for knee pain  *If you need a refill on your cardiac medications before your next appointment, please call your pharmacy*   Lab Work: Not needed .   Testing/Procedures:  Not needed  Follow-Up: At [facility], you and your health needs are our priority.  As part of our continuing mission to provide you with exceptional heart care, we have created designated Provider Care Teams.  These Care Teams include your primary Cardiologist (physician) and Advanced Practice Providers (APPs -  Physician Assistants and Nurse Practitioners) who all work together to provide you with the care you need, when you need it.     Your next appointment:   12 month(s)  The format for your next appointment:   In Person  Provider:   Bryan Lemma, MD    Other Instructions   You have been referred to [facility]-- left knee     Marykay Lex, MD, MS Bryan Lemma, M.D., M.S. Interventional Cardiologist  [facility]  Pager # [phone] Phone # [phone] [address] [place], [place] [address]   Thank you for choosing [facility] [place]!!

## 2022-10-23 ENCOUNTER — Ambulatory Visit: Payer: Medicare Other | Admitting: Podiatry

## 2022-10-26 ENCOUNTER — Encounter: Payer: Self-pay | Admitting: Cardiology

## 2022-10-26 NOTE — Assessment & Plan Note (Signed)
Stable mild edema.  Varicose veins and spider veins noted.  Continue recommend support stockings over diuretic. Afterload reduction with ARB Foot elevation and support stockings

## 2022-10-26 NOTE — Assessment & Plan Note (Signed)
Doing well with no active angina or heart failure symptoms.  History of inferior STEMI in 2010 with overlapping stents in the PDA. Remains asymptomatic with no active cardiac symptoms of chest pain or dyspnea with rest or exertion.  Mostly just a little deconditioning.  Plan: On low-dose aspirin. Now on both losartan 25 mg and Toprol 50 mg with well-controlled BP and no angina. Lipids much better controlled on Repatha.

## 2022-10-26 NOTE — Progress Notes (Incomplete)
Primary Care Provider: Richmond Campbell PA-C [facility] Cardiologist: Bryan Lemma, MD Electrophysiologist: None  Clinic Note: No chief complaint on file.   ===================================  ASSESSMENT/PLAN   Problem List Items Addressed This Visit       Cardiology Problems   Symptomatic varicose veins of both lower extremities (Chronic)   Relevant Medications   losartan (COZAAR) 25 MG tablet   Other Relevant Orders   Ambulatory referral to Orthopedics   PSVT (paroxysmal supraventricular tachycardia) (Chronic)   Relevant Medications   losartan (COZAAR) 25 MG tablet   Hyperlipidemia with target LDL less than 70; intolerant of statin in the past due to elevated LFTs (Chronic)   Relevant Medications   losartan (COZAAR) 25 MG tablet   Essential hypertension (Chronic)   Relevant Medications   losartan (COZAAR) 25 MG tablet   Coronary artery disease involving native coronary artery of native heart without angina pectoris - Primary (Chronic)   Relevant Medications   losartan (COZAAR) 25 MG tablet     Other   Presence of drug-eluting stent in right coronary artery (Chronic)   History of acute inferior wall MI (Chronic)   Other Visit Diagnoses     Left knee pain, unspecified chronicity       Relevant Orders   Ambulatory referral to Orthopedics      ===================================  HPI:    Ashley Morse is a 72 y.o. female with a PMH below who presents today for ***. Referring Provider  Richmond Campbell., PA-C.  Pertinent PMH CAD-PCI  Inf STEMI - -rPDA  (2010) Chronic Bilateral Lower Edema (related to significant varicose veins and venous reflux-status post multiple BLE  ablations)  H/p PSVT HTN /-> somewhat labile HLD -> statin intolerant (statin related myalgias); on Repatha.  I last saw Ashley Morse a year ago in May 2023.  She was to have a few rare episodes of heart racing.  But both twice every couple months.  No other cardiac  complaints.  BP controlled at home but better high in the office. She was seen by Margaretmary Dys, RPH in [facility] clinic for Repatha.  She was just seen seen on Sep 24, 2022 by Marjie Skiff, PA noting that her blood pressures at home and somewhat elevated ranging from 128-62/78-97.  Usually above the goal of 130/80.  Toprol dose been increased to 50 mg and did not notice any difference.  She had mild aches and pains but no major issues.  No cardiac symptoms of chest pain or dyspnea.  Sometimes sleeps on an incline because of sinus issues to help her breathing and drainage.  No PND or orthopnea to go with her edema.  Occasional palpitations but overall stable.  Usually they are related to stress. Restarted losartan 25 mg daily => BMP checked with plan for close follow-up.  Recent Hospitalizations: None  Reviewed  CV studies:    The following studies were reviewed today: (if available, images/films reviewed: From Epic Chart or Care Everywhere) None:  Interval History:   Ashley Morse returns here today for close follow-up to discuss results of her blood pressure.  She has been paying attention to her fluid intake and salt loading.  She says at home her blood pressures and pretty well-controlled with her recording showing a low of 113/77 high of 143/83 5.  Most of them are in the 110s to 120s range.  It seems that the lower losartan has been doing a pretty good job.  She is little bit worried  that her BP has been elevated lately because of her knees hurting her.  She has been limited walking because of left knee pain and is fearful of what she can take because she is scared of taking NSAIDs and Tylenol.  Other than the swelling with the knee, her lower extremity swelling is actually stable.  No PND or orthopnea.  No resting or exertional chest pain or pressure.  No resting or exertional dyspnea.   Her heart rate episodes have notably improved since her beta-blocker dose was increased to 50 mg.  No  lightheadedness dizziness unless she stands up too quickly.  No syncope or near syncope.  No TIA or emesis tegaserod no claudication.  REVIEWED OF SYSTEMS   Review of Systems  Constitutional:  Negative for malaise/fatigue (Not as energetic as he used to be.) and weight loss.  HENT:  Negative for congestion.   Respiratory:  Negative for cough and shortness of breath.   Gastrointestinal:  Negative for blood in stool and melena.  Genitourinary:  Negative for hematuria.  Musculoskeletal:  Positive for joint pain (Mostly left knee pain => lots of pain walking.).  Neurological:  Negative for dizziness.  Psychiatric/Behavioral: Negative.      I have reviewed and (if needed) personally updated the patient's problem list, medications, allergies, past medical and surgical history, social and family history.   PAST MEDICAL HISTORY   Past Medical History:  Diagnosis Date  . Bilateral venous insufficiency    Bilateral lower extremity edema,significant varicose vein; s/p vein stripping and venous abaltionof greater saph. vein  --> proved to be unsuccessful 2/2 vessel size.; Occasionally wears compression stockings during the wintertime.  cannot tolerate during the summertime;   . CAD S/P percutaneous coronary angioplasty 09/15/2008   Distal RPDA: 2 overlapping Promus and DES 2.5 mm x 15 mm, 2.5 mm 23 mm distal to mid RCA. EF 50%. ([place], [place])   . Complication of anesthesia    Pt states that many yrs ago after having a bunionectomy she experienced severe N & V. She is uncertain if the N & V was from the anesthesia or the antibiotic that she was given. She has not had any problems since with anesthesia. per 05/21/21 phone call w/patient  . COVID 02/2021   mild symptoms - no treatment  . Family history of adverse reaction to anesthesia    pt denied a family hx of anesthesia complications per 05/21/21 phone call  . Hemorrhoids   . Hepatitis C    Pt states that the blood bank told her that she  could no longer give blood due to Hep C. Pt had a liver bx >10 years ago. She states that she was never clear on whether she has Hep C. She has not taken any treatment as of 05/21/21.  [name] History of blood transfusion    1971  . History of: ST elevation myocardial infarction (STEMI) of inferior wall, subsequent episode of care 09/15/2008   R- PDA occlusion --> PCI with DES  . Hyperlipidemia    on simvastatin stopped due to increase liver enzyme rechecked back to normal  . Hypertension   . Hypothyroidism    On levothyroxine  . Osteoarthritis   . Presence of drug coated stent in right coronary artery 09/15/2008   See above; no longer on DAPT.  [name] Varicose veins     PAST SURGICAL HISTORY - reviewed   :Pertinent CV Procedures  Procedure Laterality Date  . CARDIAC CATHETERIZATION  10/23/2008  in [place]- for inferior STEMI, occluded RPDA  . CORONARY ANGIOPLASTY WITH STENT PLACEMENT  10/23/2008   occulsion of RPDA  -- 2 Promus DES stent overlapping ;2.5 x 15 and 2.5 x 23 mm stents from distal RCA to PDA, ef50%  . ENDOVENOUS ABLATION SAPHENOUS VEIN W/ LASER Left 08/01/2015   endovenous laser ablation left greater saphenous vein by Gretta Began MD  . ENDOVENOUS ABLATION SAPHENOUS VEIN W/ LASER Right 08/15/2015   endovenous laser ablation right greater saphenous vein by Gretta Began MD  . left venous  duplex Left 08/31/2011   Negative for DVT  . LIVER BIOPSY     around 2000, pt unsure of date x 2  . Radiofrequency Ablation - Left Greater Saphenous Vein: Unsuccessful Left 08/31/2011  . TRANSTHORACIC ECHOCARDIOGRAM  10/2008    Echo, May 2010 - following PCI for inferior STEMI. No wall motion abnormalities, EF of 60%  . VARICOSE VEIN SURGERY Right   Monitor in 04/2017 for further evaluation of palpitations showed rare PVCs but no significant arrhythmias.  Carotid dopplers in 04/2017 showed only 1-39% stenosis of the right ICA and no evidence of any stenosis on the left.   MEDICATIONS/ALLERGIES    Current Meds  Medication Sig  . ascorbic acid (VITAMIN C) 100 MG tablet   . aspirin EC 81 MG tablet Take 81 mg by mouth daily.  [name] b complex vitamins tablet Take 1 tablet by mouth daily.  . cholecalciferol (VITAMIN D) 25 MCG (1000 UT) tablet Take 1 tablet by mouth daily.   [name] estradiol (ESTRACE) 0.1 MG/GM vaginal cream   . Evolocumab (REPATHA SURECLICK) 140 MG/ML SOAJ INJECT 140 MG UNDER THE SKIN EVERY 14 DAYS  . latanoprost (XALATAN) 0.005 % ophthalmic solution 1 drop at bedtime.  [name] levothyroxine (SYNTHROID, LEVOTHROID) 88 MCG tablet Take 88 mcg by mouth daily before breakfast.  . Magnesium 400 MG CAPS Take by mouth daily.  . metoprolol succinate (TOPROL-XL) 50 MG 24 hr tablet Take 50 mg by mouth daily.  . naproxen sodium (ALEVE) 220 MG tablet Take 220 mg by mouth daily as needed. PRN  . polyethylene glycol powder (GLYCOLAX/MIRALAX) 17 GM/SCOOP powder Take 17 g by mouth daily. Drink 17g (1 scoop) dissolved in water per day.  . TURMERIC PO Take 800 mg by mouth daily.  . []  losartan (COZAAR) 25 MG tablet Take 1 tablet (25 mg total) by mouth daily.    Allergies  Allergen Reactions  . Benzalkonium Chloride Other (See Comments)    unknown  . Lactose Other (See Comments)    unknown  . Lactose Intolerance (Gi)   . Lisinopril Cough    unknown  . Pravastatin     Elevated LFTs  . Statins     Atorvastatin,simvastain. Rosuvastatin   . Sulfa Antibiotics Swelling    Skin discoloration  . Sulfamethoxazole-Trimethoprim     Other reaction(s): Other (See Comments) unknown  . Prednisone Rash    Rash/swelling    SOCIAL HISTORY/FAMILY HISTORY   Reviewed in Epic:  Pertinent findings: Non-smoker.  Maybe 1 glass of wine a week.   Social History   Social History Narrative   She is a married, mother of one living offspring with one child who died at young age.    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    She is starting to "embrace retirement" &  i8 enjoying her free time.      She starting beekeeping and other  activities.       She still trying to exercise the gym 2-3 times a week but now is up to 3-4 times a week in addition to doing walking.       She drinks occasional glass of wine, but if she drinks more not bothered her hemorrhoids.   She does not smoke.   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    OBJCTIVE -PE, EKG, labs   Wt Readings from Last 3 Encounters:  10/19/22 167 lb 6.4 oz (75.9 kg)  09/24/22 167 lb (75.8 kg)  10/07/21 162 lb 3.2 oz (73.6 kg)    Physical Exam: BP 130/80 (BP Location: Right Arm, Patient Position: Sitting, Cuff Size: Normal)   Pulse 67   Ht 5\' 4"  (1.626 m)   Wt 167 lb 6.4 oz (75.9 kg)   SpO2 96%   BMI 28.73 kg/m  Physical Exam Vitals reviewed.  Constitutional:      General: She is not in acute distress.    Appearance: Normal appearance. She is normal weight. She is not ill-appearing or toxic-appearing.  HENT:     Head: Normocephalic and atraumatic.  Neck:     Vascular: No carotid bruit.  Cardiovascular:     Rate and Rhythm: Normal rate and regular rhythm.     Pulses: Normal pulses.     Heart sounds: Normal heart sounds.  Pulmonary:     Effort: Pulmonary effort is normal. No respiratory distress.     Breath sounds: Normal breath sounds. No wheezing, rhonchi or rales.  Musculoskeletal:        General: No swelling (Trivial actual swelling-1+ bilateral.).     Cervical back: Normal range of motion and neck supple.  Skin:    General: Skin is warm and dry.     Comments: Prominent swelling varicose veins and spider veins bilateral legs. Diffuse spider veins/small varicose veins in the ankles and feet on both legs.  Less prominent spider veins and more varicose veins in the lower legs to mid thighs.  Minimal true venous stasis change.   Neurological:     General: No focal deficit present.     Mental Status: She is alert and oriented to person, place, and time.   Psychiatric:        Mood and Affect: Mood normal.        Behavior: Behavior normal.        Thought Content: Thought content normal.        Judgment: Judgment normal.    Adult ECG Report Not checked  Recent Labs:  08/17/2022 TC 129, Tg 70, HDL 66, LDL 48; TSH 2.213 WBC 4.40 - 11.00 10*3/uL 4.80  RBC 4.10 - 5.10 10*6/uL 4.29  Hemoglobin 12.3 - 15.3 g/dL 16.1  Hematocrit 09.6 - 44.6 % 38.8  Platelet Count (PLT) 150 - 450 10*3/uL 203   Ref Range & Units   Sodium 136 - 145 mmol/L 139  Potassium 3.5 - 5.1 mmol/L 4.1  Comment: NO VISIBLE HEMOLYSIS  Chloride 98 - 107 mmol/L 105  CO2 21 - 31 mmol/L 27  Anion Gap 6 - 14 mmol/L 7  Glucose, Random 70 - 99 mg/dL 91  Blood Urea Nitrogen (BUN) 7 - 25 mg/dL 13  Creatinine 0.45 - 4.09 mg/dL 8.11  eGFR >91 YN/WGN/5.62Z3 90  Albumin 3.5 - 5.7 g/dL 4.1  Total Protein 6.4 - 8.9 g/dL 7.6  Bilirubin, Total 0.3 - 1.0 mg/dL 0.6  Alkaline Phosphatase (ALP) 34 -  104 U/L 65  Aspartate Aminotransferase (AST) 13 - 39 U/L 50 High   Alanine Aminotransferase (ALT) 7 - 52 U/L 41  Calcium 8.6 - 10.3 mg/dL 8.9   Lab Results  Component Value Date   CREATININE 0.77 10/05/2022   BUN 11 10/05/2022   NA 139 10/05/2022   K 4.3 10/05/2022   CL 103 10/05/2022   CO2 25 10/05/2022     No results found for: "HGBA1C" No results found for: "TSH"  ================================================== I spent a total of 27 minutes with the patient spent in direct patient consultation.  Additional time spent with chart review  / charting (studies, outside notes, etc): 16 min Total Time: 43 min  Current medicines are reviewed at length with the patient today.  (+/- concerns) none  Notice: This dictation was prepared with Dragon dictation along with smart phrase technology. Any transcriptional errors that result from this process are unintentional and may not be corrected upon review.  Studies Ordered:   Orders Placed This Encounter   Procedures  . Ambulatory referral to Orthopedics   Meds ordered this encounter  Medications  . losartan (COZAAR) 25 MG tablet    Sig: Take 1 tablet (25 mg total) by mouth daily.    Dispense:  90 tablet    Refill:  3    Patient Instructions / Medication Changes & Studies & Tests Ordered   Patient Instructions  Medication Instructions:  No changes   May use Tylenol as need for knee pain  *If you need a refill on your cardiac medications before your next appointment, please call your pharmacy*   Lab Work: Not needed .   Testing/Procedures:  Not needed  Follow-Up: At [facility], you and your health needs are our priority.  As part of our continuing mission to provide you with exceptional heart care, we have created designated Provider Care Teams.  These Care Teams include your primary Cardiologist (physician) and Advanced Practice Providers (APPs -  Physician Assistants and Nurse Practitioners) who all work together to provide you with the care you need, when you need it.     Your next appointment:   12 month(s)  The format for your next appointment:   In Person  Provider:   Bryan Lemma, MD    Other Instructions   You have been referred to [facility]-- left knee     Marykay Lex, MD, MS Bryan Lemma, M.D., M.S. Interventional Cardiologist  [facility]  Pager # [phone] Phone # [phone] [address] [place], [place] [address]   Thank you for choosing  [facility]!!

## 2022-10-27 ENCOUNTER — Ambulatory Visit: Payer: Medicare Other | Admitting: Orthopaedic Surgery

## 2022-10-27 ENCOUNTER — Encounter: Payer: Self-pay | Admitting: Cardiology

## 2022-10-27 NOTE — Assessment & Plan Note (Signed)
Remains on aspirin for prior PCI.  Not on an additional antiplatelet agent.   Okay to hold aspirin for vascular or high risk procedures or surgeries-45 days out.

## 2022-10-27 NOTE — Assessment & Plan Note (Signed)
14 years out from inferior STEMI with PDA occlusion and 2 overlapping stents.  Preserved EF on echo.  May be some diastolic dysfunction but no true active heart failure.  No angina or actually heart failure.

## 2022-10-27 NOTE — Assessment & Plan Note (Signed)
Blood pressure at home seems to be much better than last recording showed.  The losartan has been making a difference.  Not unreasonable in the setting of CAD some diastolic dysfunction to have afterload reduction.  Plan: Continue losartan 25 mg daily-have increased to 90-day supply.  Continue Toprol 50 mg daily

## 2022-10-27 NOTE — Assessment & Plan Note (Signed)
Actively, no breakthrough spells.  Doing better on higher dose of Toprol 50 mg daily.  Discussed vagal maneuvers and avoid triggers

## 2022-10-27 NOTE — Assessment & Plan Note (Signed)
Goal LDL less than 70 and closer to 50.  Well-controlled on most recent labs.  Doing well on Repatha-tolerating well.  Labs were just checked

## 2022-10-27 NOTE — Assessment & Plan Note (Signed)
Currently now has right knee over left knee pain.  Is pending evaluation by pain evaluation.  I have given her the name of [facility] for referral.

## 2022-11-25 ENCOUNTER — Ambulatory Visit: Payer: Medicare Other | Admitting: Orthopaedic Surgery

## 2023-04-28 ENCOUNTER — Other Ambulatory Visit: Payer: Self-pay | Admitting: Cardiology

## 2023-04-28 DIAGNOSIS — I2510 Atherosclerotic heart disease of native coronary artery without angina pectoris: Secondary | ICD-10-CM

## 2023-04-28 DIAGNOSIS — E785 Hyperlipidemia, unspecified: Secondary | ICD-10-CM

## 2023-10-08 ENCOUNTER — Other Ambulatory Visit: Payer: Self-pay | Admitting: Cardiology

## 2023-10-27 ENCOUNTER — Encounter: Payer: Self-pay | Admitting: Cardiology

## 2023-10-27 ENCOUNTER — Ambulatory Visit: Payer: Medicare Other | Attending: Cardiology | Admitting: Cardiology

## 2023-10-27 VITALS — BP 136/70 | HR 59 | Ht 63.0 in | Wt 172.0 lb

## 2023-10-27 DIAGNOSIS — E785 Hyperlipidemia, unspecified: Secondary | ICD-10-CM

## 2023-10-27 DIAGNOSIS — I252 Old myocardial infarction: Secondary | ICD-10-CM

## 2023-10-27 DIAGNOSIS — I2510 Atherosclerotic heart disease of native coronary artery without angina pectoris: Secondary | ICD-10-CM

## 2023-10-27 DIAGNOSIS — I471 Supraventricular tachycardia, unspecified: Secondary | ICD-10-CM | POA: Diagnosis not present

## 2023-10-27 DIAGNOSIS — I10 Essential (primary) hypertension: Secondary | ICD-10-CM

## 2023-10-27 DIAGNOSIS — Z789 Other specified health status: Secondary | ICD-10-CM

## 2023-10-27 DIAGNOSIS — I83893 Varicose veins of bilateral lower extremities with other complications: Secondary | ICD-10-CM | POA: Diagnosis not present

## 2023-10-27 DIAGNOSIS — Z7189 Other specified counseling: Secondary | ICD-10-CM

## 2023-10-27 MED ORDER — METOPROLOL SUCCINATE ER 50 MG PO TB24: 50.0000 mg | ORAL_TABLET | Freq: Every day | ORAL | 3 refills | Status: AC

## 2023-10-27 MED ORDER — LOSARTAN POTASSIUM 25 MG PO TABS
25.0000 mg | ORAL_TABLET | Freq: Every day | ORAL | 3 refills | Status: AC
Start: 2023-10-27 — End: ?

## 2023-10-27 NOTE — Progress Notes (Signed)
 Cardiology Office Note:  .   Date:  10/31/2023  ID:  Ashley Morse, DOB 03-Apr-1951, MRN [id number] PCP: Lory Rough PA-C  [facility] Providers Cardiologist:  Randene Bustard, MD     Chief Complaint  Patient presents with   Follow-up    Annual follow-up.  Doing well   Coronary Artery Disease    No angina   Palpitations    No episodes of SVT.    Patient Profile: .     Ashley Morse is a  73 y.o. female  with a PMH reviewed below who presents here for annual follow-up at the request of Lory Rough., PA-C.  Notable PMH:  CAD with STEMI in 2010 s/p overlapping DES x2 to RCA,  Paroxysmal SVT,  Chronic bilateral lower extremity edema secondary to significant varicose veins and venous reflux s/p multiple ablations,  hypertension, hyperlipidemia, hypothyroidism, and Hepatitis C      Ashley Morse was last seen on Sep 24, 2022 for BP follow-up by Ashley Decree, PA.  Her blood pressure range had been ranging from 128-162/78-97.  For the most part blood pressure has been less than 130/80 after having increased to 50 mg Toprol .  Otherwise doing well.  No major issues.  Some aches and pains but no chest pain or pressure.  Swelling stable.  No significant palpitations.  A little short of breath walking up incline but nothing significant. => Wrote to restart losartan  25 mg daily.  Continued Toprol  50 mg, aspirin  81 mg and Repatha .  Subjective  Discussed the use of AI scribe software for clinical note transcription with the patient, who gave verbal consent to proceed.  History of Present Illness Ashley Morse is a 73 year old female with a history of supraventricular tachycardia and prior heart attack who presents for a cardiology follow-up.  She has a history of supraventricular tachycardia (SVT) and since starting metoprolol , her symptoms have significantly calmed down. Stress and physical activity, such as bending down while working in the yard, are identified as  triggers for her symptoms. She is currently on metoprolol  50 mg, losartan  25 mg, aspirin  81 mg, and Repatha . No heart failure symptoms such as shortness of breath when lying flat, worsening swelling, or heart palpitations.  In April, she experienced a significant bout of bronchitis, which took some time to recover from. Despite this, she managed to go on a camping vacation with her husband. Her blood pressure is typically around 120/80 mmHg at home, although it tends to be higher during medical visits due to anxiety. She monitors her blood pressure at home after visits to ensure it is within normal range.  She had a heart attack in 2010 and had two stents placed. Currently, she reports no chest pain, pressure, tightness, heart racing, skipping, flipping, passed out spells, dizzy or woozy spells, headaches, blurred vision, stroke symptoms, or blood in stool. Reports no atrial fibrillation.  She is on Synthroid  88 mcg for thyroid management, having found it more effective than levothyroxine . She prefers Synthroid  despite its higher cost.  Cardiovascular ROS: no chest pain or dyspnea on exertion positive for - edema and she did have shortness of breath when she was dealing with her bronchitis episodes but feeling better now.  Chronic lower extremity edema and varicose veins but pretty stable.  Relatively rare palpitations. negative for - orthopnea, paroxysmal nocturnal dyspnea, rapid heart rate, shortness of breath, or syncope or near-syncope, TIA or amaurosis fugax, claudication; melena, hematochezia hematuria or epistaxis.  Objective  She lives with her husband and enjoys working in the yard, although she has reduced her activity due to heat and humidity. Her oldest daughter died from cancer at age 40.  Studies Reviewed: Ashley Morse   EKG Interpretation Date/Time:  Wednesday October 27 2023 13:53:20 EDT Ventricular Rate:  59 PR Interval:  204 QRS Duration:  80 QT Interval:  434 QTC Calculation: 429 R  Axis:   24  Text Interpretation: Sinus bradycardia No previous ECGs available Confirmed by Randene Bustard ([id number]) on 10/27/2023 2:12:35 PM    No new studies  LABS Total cholesterol: 134 mg/dL (60/45/4098) Triglycerides: 64 mg/dL (11/91/4782) HDL: 58 mg/dL (95/62/1308) LDL: 62 mg/dL (65/78/4696) Creatinine: 0.61 mg/dL (29/52/8413) Liver function tests: Within normal limits (08/30/2023) Complete blood count: Within normal limits (08/30/2023)  RADIOLOGY Chest x-ray: Pneumonia (08/2023)  Risk Assessment/Calculations:         Physical Exam:   VS:  BP 136/70   Pulse (!) 59   Ht 5' 3 (1.6 m)   Wt 172 lb (78 kg)   SpO2 94%   BMI 30.47 kg/m    Wt Readings from Last 3 Encounters:  10/27/23 172 lb (78 kg)  10/19/22 167 lb 6.4 oz (75.9 kg)  09/24/22 167 lb (75.8 kg)    GEN: Healthy appearing.  Well nourished, well groomed in no acute distress;  NECK: No JVD; No carotid bruits CARDIAC: Normal S1, S2; RRR, no murmurs, rubs, gallops RESPIRATORY:  Clear to auscultation without rales, wheezing or rhonchi ; nonlabored, good air movement. ABDOMEN: Soft, non-tender, non-distended EXTREMITIES: Mild bilateral ankle edema with diffuse spider veins and varicose veins with violaceous skin changes but no significant dermatitis.; No deformity      ASSESSMENT AND PLAN: .    Problem List Items Addressed This Visit       Cardiology Problems   Coronary artery disease involving native coronary artery of native heart without angina pectoris - Primary (Chronic)   15 years out from her MI.  Doing well with no recurrent anginal symptoms. Lipids well-controlled with Repatha . BP palpitations and angina well-controlled with Toprol -XL 50 mg daily and losartan  25 mg daily. - Continue Repatha , Toprol  and losartan  along with aspirin  81 mg daily.      Relevant Medications   metoprolol  succinate (TOPROL -XL) 50 MG 24 hr tablet   losartan  (COZAAR ) 25 MG tablet   Other Relevant Orders   EKG 12-Lead  (Completed)   Essential hypertension (Chronic)   Blood pressure elevated today, typically controlled at home.  Anxiety-related increases during visits. - Update prescription for losartan  25 mg along with Toprol  50 mg daily - Monitor blood pressure at home and report any consistent elevations.      Relevant Medications   metoprolol  succinate (TOPROL -XL) 50 MG 24 hr tablet   losartan  (COZAAR ) 25 MG tablet   Other Relevant Orders   EKG 12-Lead (Completed)   Hyperlipidemia with target LDL less than 70; intolerant of statin in the past due to elevated LFTs (Chronic)   Lipids well-controlled with Repatha . Intolerant to most statins. -Refill Repatha  prescription.  Continue to monitor labs.      Relevant Medications   metoprolol  succinate (TOPROL -XL) 50 MG 24 hr tablet   losartan  (COZAAR ) 25 MG tablet   PSVT (paroxysmal supraventricular tachycardia) (HCC) (Chronic)   SVT episodes decreased with metoprolol  50 mg.  Occasional stress-induced episodes, but pretty short-lived.. No recent palpitations or dizziness. - Update prescription for metoprolol  50 mg.      Relevant Medications  metoprolol  succinate (TOPROL -XL) 50 MG 24 hr tablet   losartan  (COZAAR ) 25 MG tablet   Other Relevant Orders   EKG 12-Lead (Completed)   Symptomatic varicose veins of both lower extremities (Chronic)   Stable.  Wears support stockings on occasion but not always.  Does not like to wear them during the hot summer weather. She still does her exercises. Not interested in further procedures.      Relevant Medications   metoprolol  succinate (TOPROL -XL) 50 MG 24 hr tablet   losartan  (COZAAR ) 25 MG tablet     Other   Goals of care, counseling/discussion   Discussed her views on life-sustaining treatments, prioritizing quality of life over aggressive treatments without significant recovery chance.      History of acute inferior wall MI (Chronic)   STEMI treated with stents. No current chest pain or  arrhythmias. Lipid levels controlled with Repatha . No active angina or heart failure. - Ensure Repatha , and Toprol  prescriptions are up to date -Continue aspirin  81 mg daily      Statin intolerance (Chronic)   Significant myalgias on several different statins.  Unable to tolerate even intermittent dosing. Now on Repatha  with well-controlled lipids.             Follow-Up: Return in about 1 year (around 10/26/2024) for [employer].     Signed, Ashley Lacer, MD, MS Randene Bustard, M.D., M.S. Interventional Chartered certified accountant  Pager # [phone]

## 2023-10-27 NOTE — Patient Instructions (Signed)
 Medication Instructions:   No changes  Medication refilled *If you need a refill on your cardiac medications before your next appointment, please call your pharmacy*   Lab Work: Not needed    Testing/Procedures:  Not needed  Follow-Up: At [facility], you and your health needs are our priority.  As part of our continuing mission to provide you with exceptional heart care, we have created designated Provider Care Teams.  These Care Teams include your primary Cardiologist (physician) and Advanced Practice Providers (APPs -  Physician Assistants and Nurse Practitioners) who all work together to provide you with the care you need, when you need it.     Your next appointment:   12 month(s)  The format for your next appointment:   In Person  Provider:    Randene Bustard, MD or Sharren Decree, PA

## 2023-10-31 ENCOUNTER — Encounter: Payer: Self-pay | Admitting: Cardiology

## 2023-10-31 DIAGNOSIS — Z7189 Other specified counseling: Secondary | ICD-10-CM | POA: Insufficient documentation

## 2023-10-31 DIAGNOSIS — Z789 Other specified health status: Secondary | ICD-10-CM | POA: Insufficient documentation

## 2023-10-31 NOTE — Assessment & Plan Note (Signed)
 15 years out from her MI.  Doing well with no recurrent anginal symptoms. Lipids well-controlled with Repatha . BP palpitations and angina well-controlled with Toprol -XL 50 mg daily and losartan  25 mg daily. - Continue Repatha , Toprol  and losartan  along with aspirin  81 mg daily.

## 2023-10-31 NOTE — Assessment & Plan Note (Signed)
 Blood pressure elevated today, typically controlled at home.  Anxiety-related increases during visits. - Update prescription for losartan  25 mg along with Toprol  50 mg daily - Monitor blood pressure at home and report any consistent elevations.

## 2023-10-31 NOTE — Assessment & Plan Note (Signed)
 Discussed her views on life-sustaining treatments, prioritizing quality of life over aggressive treatments without significant recovery chance.

## 2023-10-31 NOTE — Assessment & Plan Note (Signed)
 SVT episodes decreased with metoprolol  50 mg.  Occasional stress-induced episodes, but pretty short-lived.. No recent palpitations or dizziness. - Update prescription for metoprolol  50 mg.

## 2023-10-31 NOTE — Assessment & Plan Note (Signed)
 Stable.  Wears support stockings on occasion but not always.  Does not like to wear them during the hot summer weather. She still does her exercises. Not interested in further procedures.

## 2023-10-31 NOTE — Assessment & Plan Note (Signed)
 Significant myalgias on several different statins.  Unable to tolerate even intermittent dosing. Now on Repatha  with well-controlled lipids.

## 2023-10-31 NOTE — Assessment & Plan Note (Signed)
 STEMI treated with stents. No current chest pain or arrhythmias. Lipid levels controlled with Repatha . No active angina or heart failure. - Ensure Repatha , and Toprol  prescriptions are up to date -Continue aspirin  81 mg daily

## 2023-10-31 NOTE — Assessment & Plan Note (Signed)
 Lipids well-controlled with Repatha . Intolerant to most statins. -Refill Repatha  prescription.  Continue to monitor labs.

## 2024-10-30 ENCOUNTER — Ambulatory Visit: Admitting: Cardiology
